# Patient Record
Sex: Male | Born: 1970
Health system: Southern US, Community
[De-identification: ages and names within clinical notes are randomized; demographics above are authoritative.]

## PROBLEM LIST (undated history)

## (undated) DIAGNOSIS — M549 Dorsalgia, unspecified: Secondary | ICD-10-CM

## (undated) DIAGNOSIS — Z789 Other specified health status: Secondary | ICD-10-CM

## (undated) DIAGNOSIS — H269 Unspecified cataract: Secondary | ICD-10-CM

## (undated) DIAGNOSIS — E785 Hyperlipidemia, unspecified: Secondary | ICD-10-CM

## (undated) DIAGNOSIS — E739 Lactose intolerance, unspecified: Secondary | ICD-10-CM

## (undated) DIAGNOSIS — K5790 Diverticulosis of intestine, part unspecified, without perforation or abscess without bleeding: Secondary | ICD-10-CM

## (undated) DIAGNOSIS — E559 Vitamin D deficiency, unspecified: Secondary | ICD-10-CM

## (undated) DIAGNOSIS — K829 Disease of gallbladder, unspecified: Secondary | ICD-10-CM

## (undated) DIAGNOSIS — F32A Depression, unspecified: Secondary | ICD-10-CM

## (undated) DIAGNOSIS — E663 Overweight: Secondary | ICD-10-CM

## (undated) DIAGNOSIS — H409 Unspecified glaucoma: Secondary | ICD-10-CM

## (undated) HISTORY — DX: Overweight: E66.3

## (undated) HISTORY — DX: Lactose intolerance, unspecified: E73.9

## (undated) HISTORY — DX: Hyperlipidemia, unspecified: E78.5

## (undated) HISTORY — DX: Unspecified cataract: H26.9

## (undated) HISTORY — PX: OTHER SURGICAL HISTORY: SHX169

## (undated) HISTORY — PX: COSMETIC SURGERY: SHX468

## (undated) HISTORY — DX: Dorsalgia, unspecified: M54.9

## (undated) HISTORY — DX: Unspecified glaucoma: H40.9

## (undated) HISTORY — DX: Depression, unspecified: F32.A

## (undated) HISTORY — DX: Disease of gallbladder, unspecified: K82.9

## (undated) HISTORY — DX: Diverticulosis of intestine, part unspecified, without perforation or abscess without bleeding: K57.90

## (undated) HISTORY — PX: CHOLECYSTECTOMY: SHX55

## (undated) HISTORY — DX: Vitamin D deficiency, unspecified: E55.9

---

## 1990-03-17 HISTORY — PX: CHOLECYSTECTOMY: SHX55

## 2007-04-26 ENCOUNTER — Ambulatory Visit (HOSPITAL_COMMUNITY): Admission: RE | Admit: 2007-04-26 | Discharge: 2007-04-26 | Payer: Self-pay | Admitting: Internal Medicine

## 2007-09-23 ENCOUNTER — Encounter (HOSPITAL_BASED_OUTPATIENT_CLINIC_OR_DEPARTMENT_OTHER): Payer: Self-pay | Admitting: General Surgery

## 2007-09-23 ENCOUNTER — Ambulatory Visit (HOSPITAL_BASED_OUTPATIENT_CLINIC_OR_DEPARTMENT_OTHER): Admission: RE | Admit: 2007-09-23 | Discharge: 2007-09-23 | Payer: Self-pay | Admitting: General Surgery

## 2008-05-29 ENCOUNTER — Ambulatory Visit (HOSPITAL_COMMUNITY): Admission: RE | Admit: 2008-05-29 | Discharge: 2008-05-29 | Payer: Self-pay | Admitting: Internal Medicine

## 2009-07-03 ENCOUNTER — Ambulatory Visit (HOSPITAL_COMMUNITY): Admission: RE | Admit: 2009-07-03 | Discharge: 2009-07-03 | Payer: Self-pay | Admitting: Internal Medicine

## 2010-07-30 NOTE — Op Note (Signed)
NAMEJOSEH, SJOGREN                   ACCOUNT NO.:  [id number]   MEDICAL RECORD NO.:  [id number]          PATIENT TYPE:  AMB   LOCATION:  DSC                          FACILITY:  [facility]   PHYSICIAN:  Leonie Man, M.D.   DATE OF BIRTH:  [birth year]   DATE OF PROCEDURE:  09/23/2007  DATE OF DISCHARGE:                               OPERATIVE REPORT   PREOPERATIVE DIAGNOSIS:  Lipoma, left upper arm.   POSTOPERATIVE DIAGNOSIS:  Lipoma, left upper arm.   PROCEDURE:  Excision, lipoma of left upper arm.   SURGEON:  Leonie Man, MD   ASSISTANT:  OR nurse.   ANESTHESIA:  General.   Mr. Cull is a 40 year old gentleman with an enlarging mass of the left  upper arm which has been present for some time, but increasing in the  last several weeks.  He is not having any significant pain, although, he  does have some degree of pain of about 2/10 over the past several weeks.  He comes to the operating room now for excision of this mass.   PROCEDURE:  Following the induction of satisfactory general anesthesia  with the patient positioned supine and the left arm is projected down by  the side and turned in the prone position to expose the outer left arm.  The area over the lipoma was prepped and draped to be included in a  sterile operative field.  Positive identification of the patient and the  procedure to be done is excision of a left upper arm lipoma was carried  out.  The area was infiltrated with 0.5% Marcaine plain.  I made a  transverse incision on the upper arm and deepened this through the skin  and subcutaneous tissue, down to the capsule of the lipoma.  This lipoma  was a multiloculated mass which extended for approximately 8 cm across  the arm, and this was dissected free from all of its interstices,  removed in its entirety, and forwarded for pathologic evaluation.  Hemostasis was obtained with electrocautery and sponge and instrument  counts were verified.  Wound was irrigated  with normal saline and then  closed with interrupted sutures of 3-0 Vicryl in the subcutaneous  tissues, and the skin was closed with 4-0 Monocryl.  The incision was  reinforced with Dermabond and Steri-Strips and a sterile dressing was  placed on the wound.  The anesthetic reversed and the patient removed  from the operating room to the recovery room in stable condition.  He  tolerated the procedure well.      Leonie Man, M.D.  Electronically Signed     PB/MEDQ  D:  09/23/2007  T:  09/24/2007  Job:  [id number]

## 2011-06-26 ENCOUNTER — Ambulatory Visit (HOSPITAL_COMMUNITY)
Admission: RE | Admit: 2011-06-26 | Discharge: 2011-06-26 | Disposition: A | Discharge status: Home / Self Care | Payer: BC Managed Care – PPO | Source: Ambulatory Visit | Attending: Internal Medicine | Admitting: Internal Medicine

## 2011-06-26 ENCOUNTER — Other Ambulatory Visit (HOSPITAL_COMMUNITY): Payer: Self-pay | Admitting: Internal Medicine

## 2011-06-26 DIAGNOSIS — M545 Low back pain, unspecified: Secondary | ICD-10-CM

## 2011-07-14 ENCOUNTER — Ambulatory Visit (HOSPITAL_COMMUNITY)
Admission: RE | Admit: 2011-07-14 | Discharge: 2011-07-14 | Disposition: A | Discharge status: Home / Self Care | Payer: BC Managed Care – PPO | Source: Ambulatory Visit | Attending: Orthopedic Surgery | Admitting: Orthopedic Surgery

## 2011-07-14 ENCOUNTER — Other Ambulatory Visit (HOSPITAL_COMMUNITY): Payer: Self-pay | Admitting: Orthopedic Surgery

## 2011-07-14 DIAGNOSIS — M545 Low back pain, unspecified: Secondary | ICD-10-CM

## 2011-07-14 DIAGNOSIS — M7130 Other bursal cyst, unspecified site: Secondary | ICD-10-CM | POA: Insufficient documentation

## 2011-07-14 DIAGNOSIS — M5126 Other intervertebral disc displacement, lumbar region: Secondary | ICD-10-CM | POA: Insufficient documentation

## 2013-02-02 ENCOUNTER — Encounter: Payer: Self-pay | Admitting: Internal Medicine

## 2013-02-02 DIAGNOSIS — E785 Hyperlipidemia, unspecified: Secondary | ICD-10-CM

## 2013-02-02 DIAGNOSIS — E559 Vitamin D deficiency, unspecified: Secondary | ICD-10-CM | POA: Insufficient documentation

## 2013-02-02 DIAGNOSIS — F32A Depression, unspecified: Secondary | ICD-10-CM

## 2013-02-02 DIAGNOSIS — F419 Anxiety disorder, unspecified: Secondary | ICD-10-CM

## 2013-02-02 DIAGNOSIS — F329 Major depressive disorder, single episode, unspecified: Secondary | ICD-10-CM

## 2013-02-02 DIAGNOSIS — E7800 Pure hypercholesterolemia, unspecified: Secondary | ICD-10-CM | POA: Insufficient documentation

## 2013-02-03 ENCOUNTER — Ambulatory Visit: Payer: Self-pay | Admitting: Emergency Medicine

## 2013-07-07 ENCOUNTER — Ambulatory Visit (INDEPENDENT_AMBULATORY_CARE_PROVIDER_SITE_OTHER): Payer: BC Managed Care – PPO | Admitting: Emergency Medicine

## 2013-07-07 ENCOUNTER — Encounter: Payer: Self-pay | Admitting: Emergency Medicine

## 2013-07-07 VITALS — BP 142/102 | HR 66 | Temp 98.4°F | Resp 18 | Ht 71.5 in | Wt 290.0 lb

## 2013-07-07 DIAGNOSIS — Z79899 Other long term (current) drug therapy: Secondary | ICD-10-CM

## 2013-07-07 DIAGNOSIS — Z23 Encounter for immunization: Secondary | ICD-10-CM

## 2013-07-07 DIAGNOSIS — E559 Vitamin D deficiency, unspecified: Secondary | ICD-10-CM

## 2013-07-07 DIAGNOSIS — R030 Elevated blood-pressure reading, without diagnosis of hypertension: Secondary | ICD-10-CM

## 2013-07-07 DIAGNOSIS — Z0000 Encounter for general adult medical examination without abnormal findings: Secondary | ICD-10-CM

## 2013-07-07 DIAGNOSIS — Z119 Encounter for screening for infectious and parasitic diseases, unspecified: Secondary | ICD-10-CM

## 2013-07-07 DIAGNOSIS — Z1212 Encounter for screening for malignant neoplasm of rectum: Secondary | ICD-10-CM

## 2013-07-07 LAB — CBC WITH DIFFERENTIAL/PLATELET
BASOS ABS: 0 10*3/uL (ref 0.0–0.1)
BASOS PCT: 0 % (ref 0–1)
EOS ABS: 0.2 10*3/uL (ref 0.0–0.7)
EOS PCT: 3 % (ref 0–5)
HEMATOCRIT: 44.2 % (ref 39.0–52.0)
Hemoglobin: 15.7 g/dL (ref 13.0–17.0)
Lymphocytes Relative: 30 % (ref 12–46)
Lymphs Abs: 2.3 10*3/uL (ref 0.7–4.0)
MCH: 30.1 pg (ref 26.0–34.0)
MCHC: 35.5 g/dL (ref 30.0–36.0)
MCV: 84.7 fL (ref 78.0–100.0)
Monocytes Absolute: 0.5 10*3/uL (ref 0.1–1.0)
Monocytes Relative: 6 % (ref 3–12)
Neutro Abs: 4.6 10*3/uL (ref 1.7–7.7)
Neutrophils Relative %: 61 % (ref 43–77)
Platelets: 253 10*3/uL (ref 150–400)
RBC: 5.22 MIL/uL (ref 4.22–5.81)
RDW: 14.3 % (ref 11.5–15.5)
WBC: 7.6 10*3/uL (ref 4.0–10.5)

## 2013-07-07 LAB — HEMOGLOBIN A1C
Hgb A1c MFr Bld: 5.6 % (ref <5.7)
MEAN PLASMA GLUCOSE: 114 mg/dL (ref <117)

## 2013-07-07 NOTE — Patient Instructions (Addendum)
Cholesterol Cholesterol is a type of fat. Your body needs a small amount of cholesterol, but too much can cause health problems. Certain problems include heart attacks, strokes, and not enough blood flow to your heart, brain, kidneys, or feet. You get cholesterol in 2 ways:  Naturally.  By eating certain foods. HOME CARE  Eat a low-fat diet:  Eat less eggs, whole dairy products (whole milk, cheese, and butter), fatty meats, and fried foods.  Eat more fruits, vegetables, whole-wheat breads, lean chicken, and fish.  Follow your exercise program as told by your doctor.  Keep your weight at a healthy level. Talk to your doctor about what is right for you.  Only take medicine as told by your doctor.  Get your cholesterol checked once a year or as told by your doctor. MAKE SURE YOU:  Understand these instructions.  Will watch your condition.  Will get help right away if you are not doing well or get worse. Document Released: 05/30/2008 Document Revised: 05/26/2011 Document Reviewed: 12/15/2012 [facility] Patient Information 2014 [place], [place]. We want weight loss that will last so you should lose 1-2 pounds a week.  THAT IS IT! Please pick THREE things a month to change. Once it is a habit check off the item. Then pick another three items off the list to become habits.  If you are already doing a habit on the list GREAT!  Cross that item off! o Don't drink your calories. Ie, alcohol, soda, fruit juice, and sweet tea.  o Drink more water. Drink a glass when you feel hungry or before each meal.  o Eat breakfast - Complex carb and protein (likeDannon light and fit yogurt, oatmeal, fruit, eggs, [place] bacon). o Measure your cereal.  Eat no more than one cup a day. (ie [place]) o Eat an apple a day. o Add a vegetable a day. o Try a new vegetable a month. o Use Pam! Stop using oil or butter to cook. o Don't finish your plate or use smaller plates. o Share your dessert. o Eat sugar free  Jello for dessert or frozen grapes. o Don't eat 2-3 hours before bed. o Switch to whole wheat bread, pasta, and brown rice. o Make healthier choices when you eat out. No fries! o Pick baked chicken, NOT fried. o Don't forget to SLOW DOWN when you eat. It is not going anywhere.  o Take the stairs. o Park far away in the parking lot o [employer] (or weights) for 10 minutes while watching TV. o Walk at work for 10 minutes during break. o Walk outside 1 time a week with your friend, kids, dog, or significant other. o Start a walking group at [place] the mall as much as you can tolerate.  o Keep a food diary. o Weigh yourself daily. o Walk for 15 minutes 3 days per week. o Cook at home more often and eat out less.  If life happens and you go back to old habits, it is okay.  Just start over. You can do it!   If you experience chest pain, get short of breath, or tired during the exercise, please stop immediately and inform your doctor.    Bad carbs also include fruit juice, alcohol, and sweet tea. These are empty calories that do not signal to your brain that you are full.   Please remember the good carbs are still carbs which convert into sugar. So please measure them out no more than 1/2-1 cup  of rice, oatmeal, pasta, and beans.  Veggies are however free foods! Pile them on.   I like lean protein at every meal such as chicken, [place], pork chops, cottage cheese, etc. Just do not fry these meats and please center your meal around vegetable, the meats should be a side dish.   No all fruit is created equal. Please see the list below, the fruit at the bottom is higher in sugars than the fruit at the top   Hypertension Hypertension is another name for high blood pressure. High blood pressure may mean that your heart needs to work harder to pump blood. Blood pressure consists of two numbers, which includes a higher number over a lower number (example: 110/72). HOME CARE   Make  lifestyle changes as told by your doctor. This may include weight loss and exercise.  Take your blood pressure medicine every day.  Limit how much salt you use.  Stop smoking if you smoke.  Do not use drugs.  Talk to your doctor if you are using decongestants or birth control pills. These medicines might make blood pressure higher.  Females should not drink more than 1 alcoholic drink per day. Males should not drink more than 2 alcoholic drinks per day.  See your doctor as told. GET HELP RIGHT AWAY IF:   You have a blood pressure reading with a top number of 180 or higher.  You get a very bad headache.  You get blurred or changing vision.  You feel confused.  You feel weak, numb, or faint.  You get chest or belly (abdominal) pain.  You throw up (vomit).  You cannot breathe very well. MAKE SURE YOU:   Understand these instructions.  Will watch your condition.  Will get help right away if you are not doing well or get worse. Document Released: 08/20/2007 Document Revised: 05/26/2011 Document Reviewed: 08/20/2007 [facility] Patient Information 2014 [place], [place].

## 2013-07-07 NOTE — Progress Notes (Signed)
Subjective:    Patient ID: Richard [name], male    DOB: 1970/12/31, 43 y.o.   MRN: [id number]  HPI Comments: 43 yo WM CPE and obesity/ cholesterol F/U. He notes he is not eating healthy routinely or exercising. He has lost 2 # since last year. He is off all RX and supplements because he wanted to see what labs would result.  Last abnormal labs  TG 208 H 33 L 117 D 39. He had negative STD panel 2014 and denies any STD symptoms today but wants rechecked.  He notes mood has improved and he does not need anxiety/ depression RX any longer. He is continuing counseling and feels this has helped greatly.   He has no other concerns     Medication List    Notice As of 07/07/2013 11:59 PM   You have not been prescribed any medications.     Allergies  Allergen Reactions  . Ppd [Tuberculin Purified Protein Derivative]     + ppd NEG CXR 2/09   Past Medical History  Diagnosis Date  . Hyperlipidemia   . Depression   . Anxiety   . Unspecified vitamin D deficiency    Past Surgical History  Procedure Laterality Date  . Cholecystectomy    . Cosmetic surgery      lipoma removal, arm/ forehead   Family History  Problem Relation Age of Onset  . Diabetes Mother   . Cancer Mother 39    breast, skin,lymphoma, deceased  . Cancer Father     prostate, skin  . Depression Sister   . Cancer Sister     sarcoidosis  . Depression Brother    History  Substance Use Topics  . Smoking status: Never Smoker   . Smokeless tobacco: Not on file  . Alcohol Use: Yes     Comment: rare   Patient Care Team: Unk Pinto, MD as PCP - General (Internal Medicine) Rozetta Nunnery, MD as Consulting Physician (Otolaryngology) Fischer, (Dentist) [facility]  MAINTENANCE: EVO:[id number] Dentist:Q 6 month  IMMUNIZATIONS: Tdap:07/07/13 Influenza:2014   Review of Systems  All other systems reviewed and are negative.  BP 142/102  Pulse 66  Temp(Src) 98.4 F (36.9 C) (Temporal)  Resp 18  Ht 5' 11.5"  (1.816 m)  Wt 290 lb (131.543 kg)  BMI 39.89 kg/m2 Recheck 130/90    Objective:   Physical Exam  Nursing note and vitals reviewed. Constitutional: He is oriented to person, place, and time. He appears well-developed and well-nourished.  obese  HENT:  Head: Normocephalic and atraumatic.  Right Ear: External ear normal.  Left Ear: External ear normal.  Nose: Nose normal.  Mouth/Throat: No oropharyngeal exudate.  Eyes: Conjunctivae and EOM are normal. Pupils are equal, round, and reactive to light. Right eye exhibits no discharge. Left eye exhibits no discharge. No scleral icterus.  Neck: Normal range of motion. Neck supple. No JVD present. No tracheal deviation present. No thyromegaly present.  Cardiovascular: Normal rate, regular rhythm, normal heart sounds and intact distal pulses.   Pulmonary/Chest: Effort normal and breath sounds normal.  Abdominal: Soft. Bowel sounds are normal. He exhibits no distension. There is no tenderness. There is no rebound and no guarding.  Genitourinary:  Def 2016  Musculoskeletal: Normal range of motion. He exhibits no edema and no tenderness.  Lymphadenopathy:    He has no cervical adenopathy.  Neurological: He is alert and oriented to person, place, and time. He has normal reflexes. No cranial nerve deficit. He exhibits normal muscle  tone. Coordination normal.  Skin: Skin is warm and dry. No rash noted. No erythema. No pallor.  Right great toe nail thick/ discolored. Between all toes mild erythema/ excoriation  Psychiatric: He has a normal mood and affect. His behavior is normal. Judgment and thought content normal.      EKG NSCSPT WNL     Assessment & Plan:  1. CPE- Update screening labs/ History/ Immunizations/ Testing as needed. Advised healthy diet, QD exercise, increase H20 and continue RX/ Vitamins AD.  2.  Irreg Nevi- monitor for any change, call if occurs for removal  3. Athletes foot/ nail fungus- advised of hygiene, try gold bond  powder OTC  4. Anxiety HX- Seems controlled, continue counseling AD  5. Cholesterol- recheck labs, Need to eat healthier and exercise AD.  6. Obesity- Continue weight loss, increase activity and better diet. Pt aware of risks. Check labs

## 2013-07-08 LAB — URINALYSIS, ROUTINE W REFLEX MICROSCOPIC
Bilirubin Urine: NEGATIVE
Glucose, UA: NEGATIVE mg/dL
HGB URINE DIPSTICK: NEGATIVE
KETONES UR: NEGATIVE mg/dL
Leukocytes, UA: NEGATIVE
NITRITE: NEGATIVE
PROTEIN: NEGATIVE mg/dL
SPECIFIC GRAVITY, URINE: 1.014 (ref 1.005–1.030)
UROBILINOGEN UA: 0.2 mg/dL (ref 0.0–1.0)
pH: 6 (ref 5.0–8.0)

## 2013-07-08 LAB — HEPATIC FUNCTION PANEL
ALBUMIN: 4.7 g/dL (ref 3.5–5.2)
ALT: 53 U/L (ref 0–53)
AST: 28 U/L (ref 0–37)
Alkaline Phosphatase: 66 U/L (ref 39–117)
BILIRUBIN DIRECT: 0.2 mg/dL (ref 0.0–0.3)
BILIRUBIN INDIRECT: 0.7 mg/dL (ref 0.2–1.2)
BILIRUBIN TOTAL: 0.9 mg/dL (ref 0.2–1.2)
Total Protein: 6.7 g/dL (ref 6.0–8.3)

## 2013-07-08 LAB — BASIC METABOLIC PANEL WITH GFR
BUN: 9 mg/dL (ref 6–23)
CALCIUM: 9.8 mg/dL (ref 8.4–10.5)
CO2: 27 mEq/L (ref 19–32)
CREATININE: 1 mg/dL (ref 0.50–1.35)
Chloride: 101 mEq/L (ref 96–112)
GLUCOSE: 84 mg/dL (ref 70–99)
POTASSIUM: 3.9 meq/L (ref 3.5–5.3)
SODIUM: 140 meq/L (ref 135–145)

## 2013-07-08 LAB — VITAMIN D 25 HYDROXY (VIT D DEFICIENCY, FRACTURES): VIT D 25 HYDROXY: 28 ng/mL — AB (ref 30–89)

## 2013-07-08 LAB — LIPID PANEL
CHOLESTEROL: 193 mg/dL (ref 0–200)
HDL: 40 mg/dL (ref >39)
LDL CALC: 106 mg/dL — AB (ref 0–99)
Total CHOL/HDL Ratio: 4.8 Ratio
Triglycerides: 236 mg/dL — ABNORMAL HIGH (ref <150)
VLDL: 47 mg/dL — ABNORMAL HIGH (ref 0–40)

## 2013-07-08 LAB — HSV(HERPES SIMPLEX VRS) I + II AB-IGG: HSV 1 Glycoprotein G Ab, IgG: <0.1 IV

## 2013-07-08 LAB — PSA: PSA: 0.98 ng/mL (ref <=4.00)

## 2013-07-08 LAB — GC PROBE AMPLIFICATION, URINE: GC Probe Amp, Urine: NEGATIVE

## 2013-07-08 LAB — MAGNESIUM: MAGNESIUM: 1.8 mg/dL (ref 1.5–2.5)

## 2013-07-08 LAB — MICROALBUMIN / CREATININE URINE RATIO
Creatinine, Urine: 115.2 mg/dL
MICROALB UR: 4.07 mg/dL — AB (ref 0.00–1.89)
MICROALB/CREAT RATIO: 35.3 mg/g — AB (ref 0.0–30.0)

## 2013-07-08 LAB — HEPATITIS PANEL, ACUTE
HCV AB: NEGATIVE
HEP B C IGM: NONREACTIVE
Hep A IgM: NONREACTIVE
Hepatitis B Surface Ag: NEGATIVE

## 2013-07-08 LAB — RPR

## 2013-07-08 LAB — TSH: TSH: 2.014 u[IU]/mL (ref 0.350–4.500)

## 2013-07-08 LAB — INSULIN, FASTING: INSULIN FASTING, SERUM: 12 u[IU]/mL (ref 3–28)

## 2013-07-08 LAB — TESTOSTERONE: TESTOSTERONE: 312 ng/dL (ref 300–890)

## 2013-07-08 LAB — HIV ANTIBODY (ROUTINE TESTING W REFLEX): HIV 1&2 Ab, 4th Generation: NONREACTIVE

## 2013-08-11 ENCOUNTER — Ambulatory Visit: Payer: Self-pay | Admitting: Emergency Medicine

## 2013-08-16 ENCOUNTER — Encounter: Payer: Self-pay | Admitting: Internal Medicine

## 2013-10-10 ENCOUNTER — Ambulatory Visit (INDEPENDENT_AMBULATORY_CARE_PROVIDER_SITE_OTHER): Payer: BC Managed Care – PPO | Admitting: Internal Medicine

## 2013-10-10 VITALS — BP 124/82 | HR 77 | Temp 98.0°F | Resp 16 | Ht 71.5 in | Wt 296.5 lb

## 2013-10-10 DIAGNOSIS — M542 Cervicalgia: Secondary | ICD-10-CM

## 2013-10-10 DIAGNOSIS — S139XXA Sprain of joints and ligaments of unspecified parts of neck, initial encounter: Secondary | ICD-10-CM

## 2013-10-10 DIAGNOSIS — S161XXA Strain of muscle, fascia and tendon at neck level, initial encounter: Secondary | ICD-10-CM

## 2013-10-10 MED ORDER — METHOCARBAMOL 750 MG PO TABS: 750.0000 mg | ORAL_TABLET | Freq: Four times a day (QID) | ORAL | Status: DC

## 2013-10-10 MED ORDER — HYDROCODONE-ACETAMINOPHEN 5-325 MG PO TABS: 1.0000 | ORAL_TABLET | Freq: Four times a day (QID) | ORAL | Status: DC | PRN

## 2013-10-10 NOTE — Progress Notes (Signed)
   Subjective:    Patient ID: Richard [name], male    DOB: 10-Sep-1970, 43 y.o.   MRN: [id number]  HPI A 43 year old male states he noticed a slightly hard knot on the right side of his neck on this past Friday. He states the knot has moved from his neck towards his right shoulder.  Pain is sharp and has moved from the neck to his shoulder down his right arm. He states he has been taking OTC Ibuprofen, hot/cold compress with no relief. No swelling indicated. He also states that pain wakes him up during the night. Denies fever, chills, night sweats or prior injury. Woke up with pain, no injury and no phx of neck problems  Review of Systems     Objective:   Physical Exam  Constitutional: He is oriented to person, place, and time. He appears well-developed and well-nourished. No distress.  HENT:  Head: Normocephalic.  Eyes: EOM are normal.  Pulmonary/Chest: Effort normal.  Musculoskeletal:       Cervical back: He exhibits decreased range of motion, tenderness, bony tenderness and pain. He exhibits no swelling, no edema, no deformity, no laceration and normal pulse.  Neurological: He is alert and oriented to person, place, and time. He has normal reflexes. No cranial nerve deficit or sensory deficit. He exhibits normal muscle tone. Coordination normal.          Assessment & Plan:  Wry neck/Strain neck Robaxin/RICE/Vicodin

## 2013-10-10 NOTE — Patient Instructions (Signed)

## 2013-10-10 NOTE — Progress Notes (Signed)
   Subjective:    Patient ID: Richard [name], male    DOB: 16-Jun-1970, 43 y.o.   MRN: [id number]  HPI    Review of Systems     Objective:   Physical Exam        Assessment & Plan:

## 2014-07-17 ENCOUNTER — Encounter: Payer: Self-pay | Admitting: Emergency Medicine

## 2015-06-19 DIAGNOSIS — F320 Major depressive disorder, single episode, mild: Secondary | ICD-10-CM | POA: Diagnosis not present

## 2015-06-26 ENCOUNTER — Encounter: Payer: Self-pay | Admitting: Primary Care

## 2015-06-26 ENCOUNTER — Other Ambulatory Visit: Payer: Self-pay | Admitting: Internal Medicine

## 2015-06-26 ENCOUNTER — Ambulatory Visit (INDEPENDENT_AMBULATORY_CARE_PROVIDER_SITE_OTHER): Payer: BLUE CROSS/BLUE SHIELD | Admitting: Primary Care

## 2015-06-26 VITALS — BP 124/72 | HR 60 | Temp 97.7°F | Ht 71.0 in | Wt 261.0 lb

## 2015-06-26 DIAGNOSIS — E785 Hyperlipidemia, unspecified: Secondary | ICD-10-CM

## 2015-06-26 DIAGNOSIS — D179 Benign lipomatous neoplasm, unspecified: Secondary | ICD-10-CM

## 2015-06-26 DIAGNOSIS — R229 Localized swelling, mass and lump, unspecified: Secondary | ICD-10-CM

## 2015-06-26 DIAGNOSIS — L609 Nail disorder, unspecified: Secondary | ICD-10-CM | POA: Diagnosis not present

## 2015-06-26 DIAGNOSIS — R0981 Nasal congestion: Secondary | ICD-10-CM | POA: Insufficient documentation

## 2015-06-26 DIAGNOSIS — L608 Other nail disorders: Secondary | ICD-10-CM

## 2015-06-26 NOTE — Patient Instructions (Signed)
Congratulations on your weight loss! Continue your healthy diet and exercise. Take a look at my recommendations below.  Nuts: Try Raw nuts as they contain the healthy oils and nutrients that are lost when they are "processed or roasted".   Vegetables: Incorporate vegetables with each meal if possible. Limit starchy vegetables such as corn and potatoes.  Reduce consumption of coffee over time.   You will be contacted regarding your referral to Podiatry and Dermatology.  Please let us know if you have not heard back within one week.   Please schedule a physical with me within the next 6 months. You may also schedule a lab only appointment 3-4 days prior. We will discuss your lab results in detail during your physical.  It was a pleasure to meet you today! Please don't hesitate to call me with any questions. Welcome to [facility]!

## 2015-06-26 NOTE — Assessment & Plan Note (Signed)
History of 2 lipomas removed in past. 2 cm mass to occipital cranium x 6 years, enlarging, does not cause pain. Feels to be benign lipoma or cyst.  Referral made to dermatology for further evaluation.

## 2015-06-26 NOTE — Assessment & Plan Note (Signed)
History of for most of his life. Currently meeting with ENT in [place] to discuss surgical options. Currently on Afrin and also scheduled for sleep study in May.

## 2015-06-26 NOTE — Assessment & Plan Note (Signed)
Did not mention today, will repeat this summer during annual physical exam. Currently not managed on medication, but is also working on weight loss through healthy diet and exercise.

## 2015-06-26 NOTE — Progress Notes (Signed)
Pre visit review using our clinic review tool, if applicable. No additional management support is needed unless otherwise documented below in the visit note. 

## 2015-06-26 NOTE — Progress Notes (Signed)
Subjective:    Patient ID: Richard [name], male    DOB: [birth year], 45 y.o.   MRN: [id number]  HPI  Richard [name] is a 45 year old male who presents today to establish care and discuss the problems mentioned below. [name] obtain old records. His last physical was about 1 year.   1) Lipoma: History of numerous lipomas. He's had them removed to his left upper extremity and forehead several years ago. He now has a lipoma to the occipital region that has been present for the past 6-7 years. He believes this lipoma is enlarging. Denies headaches.   2) Dermatological Issues: Cracked skin to the plantar surface of his bilateral feet since Thanksgiving 2016 when he started exercising. Over the past several months he's noticed increased cracking to his feet. He [name] train with a personal trainer once weekly and [name] run 3 days of the week. He endorses wearing appropriate shoes. He's noticed nail discoloration and warping of the toe nails of bilateral great toes over the past several years. He would like evaluation as this is embarrassing. Denies pain to the plantar surface of his feet or to toe nails.   3) Obesity: Struggled with most of his life. In 2008 he was at high risk for MI as he weighed over 300 pounds so he participated in a program for healthy lifestyle changes and lost 100 pounds. Over the years he has slowly re-gained his weight. He decided this past Thanksgiving to improve his diet as he was nearing 300 pounds. He's currently eating a "paleo" diet and exercising 4 days weekly. He's lost 38 pounds since Thanksgiving 2016.   His diet currently consists of: Breakfast: Eggs with protein, coffee Lunch: Skips as he is busy. [name] eat "RX" bars occasionally. Dinner: Chicken (Grilled/baked, sometimes chicken wings or nuggets), vegetable, fruit. He's limiting bread and sugars. Snacks: Nuts and fruit Beverages: Black coffee (60 ounces of coffee daily), water (64-96 ounces daily)  4) Nasal Congestion: Chronic  most of his life. He experiences daily congestion and a reduction in nasal airway passage. He was evaluated by an ENT in [facility] who recommended Afrin and sleep study. He is scheduled for a sleep study in May 2017 as he constantly snores. He is considering nasal septum surgery and also possibly to his soft palate. He notices his congestion is improved with exercise. He is still considering surgery but has not yet made a decision.  Review of Systems  Constitutional: Negative for unexpected weight change.  HENT: Positive for congestion.   Respiratory: Negative for shortness of breath.   Cardiovascular: Negative for chest pain.  Genitourinary:       FH of prostate cancer in his father.  Skin:       Dry, cracked skin to heels bilaterally. Misshapen toe nails to great toes. Lipoma to occipital region.       Past Medical History  Diagnosis Date  . Hyperlipidemia   . Depression   . Anxiety   . Unspecified vitamin D deficiency     Social History   Social History  . Marital Status: Divorced    Spouse Name: N/A  . Number of Children: N/A  . Years of Education: N/A   Occupational History  . Not on file.   Social History Main Topics  . Smoking status: Never Smoker   . Smokeless tobacco: Not on file  . Alcohol Use: 0.0 oz/week    0 Standard drinks or equivalent per week     Comment: rare  .  Drug Use: No  . Sexual Activity: Not on file   Other Topics Concern  . Not on file   Social History Narrative    Past Surgical History  Procedure Laterality Date  . Cholecystectomy    . Cosmetic surgery      lipoma removal, arm/ forehead    Family History  Problem Relation Age of Onset  . Diabetes Mother   . Cancer Mother 9    breast, skin,lymphoma, deceased  . Cancer Father     prostate, skin  . Depression Sister   . Cancer Sister     sarcoidosis  . Depression Brother     Allergies  Allergen Reactions  . Ppd [Tuberculin Purified Protein Derivative]     + ppd NEG CXR  2/09    No current outpatient prescriptions on file prior to visit.   No current facility-administered medications on file prior to visit.    BP 124/72 mmHg  Pulse 60  Temp(Src) 97.7 F (36.5 C) (Oral)  Ht 5\' 11"  (1.803 m)  Wt 261 lb (118.389 kg)  BMI 36.42 kg/m2  SpO2 96%    Objective:   Physical Exam  Constitutional: He is oriented to person, place, and time. He appears well-nourished.  Neck: Neck supple.  Cardiovascular: Normal rate and regular rhythm.   Pulmonary/Chest: Effort normal and breath sounds normal. He has no wheezes. He has no rales.  Neurological: He is alert and oriented to person, place, and time.  Skin: Skin is warm and dry.  Mild to moderate cracked skin to right heel. No s/s of infection. Bilateral great toes with yellow color and misshapen toe nails.   2 cm mass to occipital region of cranium, lipoma or cyst like.   Psychiatric: He has a normal mood and affect.          Assessment & Plan:

## 2015-06-26 NOTE — Assessment & Plan Note (Signed)
Located to right great toe nail with yellow discoloration. Suspect shape due to gait from obesity. Will send to podiatry for evaluation and possibly for orthotics.

## 2015-07-03 DIAGNOSIS — F320 Major depressive disorder, single episode, mild: Secondary | ICD-10-CM | POA: Diagnosis not present

## 2015-07-10 ENCOUNTER — Encounter: Payer: Self-pay | Admitting: Primary Care

## 2015-07-17 DIAGNOSIS — F320 Major depressive disorder, single episode, mild: Secondary | ICD-10-CM | POA: Diagnosis not present

## 2015-07-24 DIAGNOSIS — F320 Major depressive disorder, single episode, mild: Secondary | ICD-10-CM | POA: Diagnosis not present

## 2015-07-26 ENCOUNTER — Ambulatory Visit (INDEPENDENT_AMBULATORY_CARE_PROVIDER_SITE_OTHER): Payer: BLUE CROSS/BLUE SHIELD | Admitting: Podiatry

## 2015-07-26 ENCOUNTER — Encounter: Payer: Self-pay | Admitting: Podiatry

## 2015-07-26 VITALS — BP 154/85 | HR 69 | Resp 18

## 2015-07-26 DIAGNOSIS — B353 Tinea pedis: Secondary | ICD-10-CM | POA: Diagnosis not present

## 2015-07-26 DIAGNOSIS — L853 Xerosis cutis: Secondary | ICD-10-CM

## 2015-07-26 DIAGNOSIS — B351 Tinea unguium: Secondary | ICD-10-CM

## 2015-07-26 MED ORDER — UREA 40 % EX CREA: 1.0000 "application " | TOPICAL_CREAM | Freq: Every day | CUTANEOUS | Status: DC

## 2015-07-26 MED ORDER — KETOCONAZOLE 2 % EX CREA: 1.0000 "application " | TOPICAL_CREAM | Freq: Every day | CUTANEOUS | Status: DC

## 2015-07-26 NOTE — Progress Notes (Signed)
   Subjective:    Patient ID: Richard [name], male    DOB: [birth year], 45 y.o.   MRN: [id number]  HPI  45 year old male presents the also concerns of fungus to both of his big toenail which is been ongoing for about 2 years. He states the areas are not painful but he is concerned of the way it looks. He has had pain and previously was doing running or a lot of activity. He has tried soaking in Epson salts. He also has dry skin to his heels. Denies any cracking, drainage. No other complaints.    Review of Systems  All other systems reviewed and are negative.      Objective:   Physical Exam General: AAO x3, NAD  Dermatological: Bilateral hallux nails are somewhat dystrophic, discolored, hypertrophic and incurvated along the medial and lateral nail borders. There is no tenderness the nails there is no edema, erythema, drainage or pus. There is dry, xerotic skin the plantar aspect of the foot into the heels. No open lesions. No fissuring. There is also small amount of dry, xerotic, erythematous skin interdigitally consistent with tinea pedis. There is no pain with calf compression, swelling, warmth, erythema.   Vascular: DP/PT pulses 2/4, CRT less than 3 seconds. There is no pain with calf compression, swelling, warmth, erythema.  Neruologic: Grossly intact via light touch bilateral. Vibratory intact via tuning fork bilateral. Protective threshold with Semmes Wienstein monofilament intact to all pedal sites bilateral. Patellar and Achilles deep tendon reflexes 2+ bilateral. No Babinski or clonus noted bilateral.   Musculoskeletal: No gross boney pedal deformities bilateral. No pain, crepitus, or limitation noted with foot and ankle range of motion bilateral. Muscular strength 5/5 in all groups tested bilateral.  Gait: Unassisted, Nonantalgic.       Assessment & Plan:  45 year old male with tinea pedis, xerosis, onychodystrophy likely onychomycosis -Treatment options discussed including all  alternatives, risks, and complications -Etiology of symptoms were discussed -Prescribed urea cream for dry skin. He can also use the stoma toenails.  -Ketoconazole for tinea pedis  -Hallux nails were sent for biopsy to [facility] -Follow-up after nail culture or sooner if any problems arise. In the meantime, encouraged to call the office with any questions, concerns, change in symptoms.   Celesta Gentile, DPM

## 2015-07-26 NOTE — Patient Instructions (Signed)
Onychomycosis/Fungal Toenails  WHAT IS IT? An infection that lies within the keratin of your nail plate that is caused by a fungus.  WHY ME? Fungal infections affect all ages, sexes, races, and creeds.  There may be many factors that predispose you to a fungal infection such as age, coexisting medical conditions such as diabetes, or an autoimmune disease; stress, medications, fatigue, genetics, etc.  Bottom line: fungus thrives in a warm, moist environment and your shoes offer such a location.  IS IT CONTAGIOUS? Theoretically, yes.  You do not want to share shoes, nail clippers or files with someone who has fungal toenails.  Walking around barefoot in the same room or sleeping in the same bed is unlikely to transfer the organism.  It is important to realize, however, that fungus can spread easily from one nail to the next on the same foot.  HOW DO WE TREAT THIS?  There are several ways to treat this condition.  Treatment may depend on many factors such as age, medications, pregnancy, liver and kidney conditions, etc.  It is best to ask your doctor which options are available to you.  1. No treatment.   Unlike many other medical concerns, you can live with this condition.  However for many people this can be a painful condition and may lead to ingrown toenails or a bacterial infection.  It is recommended that you keep the nails cut short to help reduce the amount of fungal nail. 2. Topical treatment.  These range from herbal remedies to prescription strength nail lacquers.  About 40-50% effective, topicals require twice daily application for approximately 9 to 12 months or until an entirely new nail has grown out.  The most effective topicals are medical grade medications available through physicians offices. 3. Oral antifungal medications.  With an 80-90% cure rate, the most common oral medication requires 3 to 4 months of therapy and stays in your system for a year as the new nail grows out.  Oral  antifungal medications do require blood work to make sure it is a safe drug for you.  A liver function panel will be performed prior to starting the medication and after the first month of treatment.  It is important to have the blood work performed to avoid any harmful side effects.  In general, this medication safe but blood work is required. 4. Laser Therapy.  This treatment is performed by applying a specialized laser to the affected nail plate.  This therapy is noninvasive, fast, and non-painful.  It is not covered by insurance and is therefore, out of pocket.  The results have been very good with a 80-95% cure rate.  [facility] is the only practice in the area to offer this therapy. 5. Permanent Nail Avulsion.  Removing the entire nail so that a new nail will not grow back.  Athlete's Foot Athlete's foot (tinea pedis) is a fungal infection of the skin on the feet. It often occurs on the skin between the toes or underneath the toes. It can also occur on the soles of the feet. Athlete's foot is more likely to occur in hot, humid weather. Not washing your feet or changing your socks often enough can contribute to athlete's foot. The infection can spread from person to person (contagious). CAUSES Athlete's foot is caused by a fungus. This fungus thrives in warm, moist places. Most people get athlete's foot by sharing shower stalls, towels, and wet floors with an infected person. People with weakened  immune systems, including those with diabetes, may be more likely to get athlete's foot. SYMPTOMS  6. Itchy areas between the toes or on the soles of the feet. 7. White, flaky, or scaly areas between the toes or on the soles of the feet. 8. Tiny, intensely itchy blisters between the toes or on the soles of the feet. 9. Tiny cuts on the skin. These cuts can develop a bacterial infection. 10. Thick or discolored toenails. DIAGNOSIS  Your caregiver can usually tell what the problem is by doing a  physical exam. Your caregiver may also take a skin sample from the rash area. The skin sample may be examined under a microscope, or it may be tested to see if fungus will grow in the sample. A sample may also be taken from your toenail for testing. TREATMENT  Over-the-counter and prescription medicines can be used to kill the fungus. These medicines are available as powders or creams. Your caregiver can suggest medicines for you. Fungal infections respond slowly to treatment. You may need to continue using your medicine for several weeks. PREVENTION   Do not share towels.  Wear sandals in wet areas, such as shared locker rooms and shared showers.  Keep your feet dry. Wear shoes that allow air to circulate. Wear cotton or wool socks. HOME CARE INSTRUCTIONS   Take medicines as directed by your caregiver. Do not use steroid creams on athlete's foot.  Keep your feet clean and cool. Wash your feet daily and dry them thoroughly, especially between your toes.  Change your socks every day. Wear cotton or wool socks. In hot climates, you may need to change your socks 2 to 3 times per day.  Wear sandals or canvas tennis shoes with good air circulation.  If you have blisters, soak your feet in Burow's solution or Epsom salts for 20 to 30 minutes, 2 times a day to dry out the blisters. Make sure you dry your feet thoroughly afterward. SEEK MEDICAL CARE IF:   You have a fever.  You have swelling, soreness, warmth, or redness in your foot.  You are not getting better after 7 days of treatment.  You are not completely cured after 30 days.  You have any problems caused by your medicines. MAKE SURE YOU:   Understand these instructions.  Will watch your condition.  Will get help right away if you are not doing well or get worse.   This information is not intended to replace advice given to you by your health care provider. Make sure you discuss any questions you have with your health care  provider.   Document Released: 02/29/2000 Document Revised: 05/26/2011 Document Reviewed: 09/04/2014 Elsevier Interactive Patient Education [employer].

## 2015-07-29 ENCOUNTER — Encounter: Payer: Self-pay | Admitting: Podiatry

## 2015-07-29 DIAGNOSIS — L853 Xerosis cutis: Secondary | ICD-10-CM | POA: Insufficient documentation

## 2015-07-29 DIAGNOSIS — B353 Tinea pedis: Secondary | ICD-10-CM | POA: Insufficient documentation

## 2015-08-06 ENCOUNTER — Ambulatory Visit (HOSPITAL_BASED_OUTPATIENT_CLINIC_OR_DEPARTMENT_OTHER): Payer: BLUE CROSS/BLUE SHIELD | Attending: Otolaryngology | Admitting: Internal Medicine

## 2015-08-06 VITALS — Ht 71.0 in | Wt 247.0 lb

## 2015-08-06 DIAGNOSIS — R0902 Hypoxemia: Secondary | ICD-10-CM | POA: Insufficient documentation

## 2015-08-06 DIAGNOSIS — R0683 Snoring: Secondary | ICD-10-CM | POA: Insufficient documentation

## 2015-08-06 DIAGNOSIS — G4733 Obstructive sleep apnea (adult) (pediatric): Secondary | ICD-10-CM | POA: Diagnosis not present

## 2015-08-07 DIAGNOSIS — F320 Major depressive disorder, single episode, mild: Secondary | ICD-10-CM | POA: Diagnosis not present

## 2015-08-12 DIAGNOSIS — R0683 Snoring: Secondary | ICD-10-CM

## 2015-08-12 NOTE — Procedures (Signed)
  Patient Name: Richard [name], Richard [name] Date: 08/06/2015 Gender: Male D.O.B: March 18, 1970 Age (years): 45 Referring Provider: [name] Height (inches): 71 Interpreting Physician: Baird Lyons MD, ABSM Weight (lbs): 247 RPSGT: Joni Reining BMI: 34 MRN: [id number] Neck Size: 17.00 CLINICAL INFORMATION Sleep Study Type: NPSG Indication for sleep study: Snoring Epworth Sleepiness Score: 12  SLEEP STUDY TECHNIQUE As per the AASM Manual for the Scoring of Sleep and Associated Events v2.3 (April 2016) with a hypopnea requiring 4% desaturations. The channels recorded and monitored were frontal, central and occipital EEG, electrooculogram (EOG), submentalis EMG (chin), nasal and oral airflow, thoracic and abdominal wall motion, anterior tibialis EMG, snore microphone, electrocardiogram, and pulse oximetry.  MEDICATIONS Patient's medications include: charted for review. Medications self-administered by patient during sleep study : No sleep medicine administered.  SLEEP ARCHITECTURE The study was initiated at 10:50:56 PM and ended at 4:52:39 AM. Sleep onset time was 4.0 minutes and the sleep efficiency was 94.5%. The total sleep time was 341.7 minutes. Stage REM latency was 81.0 minutes. The patient spent 8.05% of the night in stage N1 sleep, 74.25% in stage N2 sleep, 0.00% in stage N3 and 17.70% in REM. Alpha intrusion was absent. Supine sleep was 38.64%.  RESPIRATORY PARAMETERS The overall apnea/hypopnea index (AHI) was 9.0 per hour. There were 16 total apneas, including 16 obstructive, 0 central and 0 mixed apneas. There were 35 hypopneas and 15 RERAs. The AHI during Stage REM sleep was 29.8 per hour. AHI while supine was 21.8 per hour. The mean oxygen saturation was 92.30%. The minimum SpO2 during sleep was 80.00%. Moderate snoring was noted during this study.  CARDIAC DATA The 2 lead EKG demonstrated sinus rhythm. The mean heart rate was 54.22 beats per minute. Other EKG  findings include: None.  LEG MOVEMENT DATA The total PLMS were 0 with a resulting PLMS index of 0.00. Associated arousal with leg movement index was 0.0 .  IMPRESSIONS - Mild obstructive sleep apnea occurred during this study (AHI = 9.0/h). - There were not enough early events to qualify for split CPAP titration protocol. - No significant central sleep apnea occurred during this study (CAI = 0.0/h). - Moderate oxygen desaturation was noted during this study (Min O2 = 80.00%). - The patient snored with Moderate snoring volume. - No cardiac abnormalities were noted during this study. - Clinically significant periodic limb movements did not occur during sleep. No significant associated arousals.  DIAGNOSIS - Obstructive Sleep Apnea (327.23 [G47.33 ICD-10]) - Nocturnal Hypoxemia (327.26 [G47.36 ICD-10])  RECOMMENDATIONS - Therapeutic CPAP titration or a fitted oral appliance would usually be first choices. Other interventions will be based on clinical judgment. - Positional therapy avoiding supine position during sleep. - Avoid alcohol, sedatives and other CNS depressants that may worsen sleep apnea and disrupt normal sleep architecture. - Sleep hygiene should be reviewed to assess factors that may improve sleep quality. - Weight management and regular exercise should be initiated or continued if appropriate.  Deneise Lever Diplomate, American Board of Sleep Medicine  ELECTRONICALLY SIGNED ON:  08/12/2015, 3:28 PM [place] PH: [phone]   FX: [phone] [place]

## 2015-08-21 DIAGNOSIS — F320 Major depressive disorder, single episode, mild: Secondary | ICD-10-CM | POA: Diagnosis not present

## 2015-08-28 DIAGNOSIS — F320 Major depressive disorder, single episode, mild: Secondary | ICD-10-CM | POA: Diagnosis not present

## 2015-09-04 ENCOUNTER — Encounter: Payer: Self-pay | Admitting: Podiatry

## 2015-09-04 ENCOUNTER — Ambulatory Visit (INDEPENDENT_AMBULATORY_CARE_PROVIDER_SITE_OTHER): Payer: BLUE CROSS/BLUE SHIELD | Admitting: Podiatry

## 2015-09-04 DIAGNOSIS — Z79899 Other long term (current) drug therapy: Secondary | ICD-10-CM

## 2015-09-04 DIAGNOSIS — B351 Tinea unguium: Secondary | ICD-10-CM | POA: Diagnosis not present

## 2015-09-04 DIAGNOSIS — F320 Major depressive disorder, single episode, mild: Secondary | ICD-10-CM | POA: Diagnosis not present

## 2015-09-04 MED ORDER — TERBINAFINE HCL 250 MG PO TABS: 250.0000 mg | ORAL_TABLET | Freq: Every day | ORAL | Status: DC

## 2015-09-04 NOTE — Patient Instructions (Signed)

## 2015-09-04 NOTE — Progress Notes (Signed)
Patient ID: Richard [name], male   DOB: [birth year], 45 y.o.   MRN: [id number]  Subjective: 45 year-old male presents the op city discussed nail culture results. He denies any acute changes and last upon it. No pain or redness or swelling to the toenails.Denies any systemic complaints such as fevers, chills, nausea, vomiting. No acute changes since last appointment, and no other complaints at this time.   Objective: AAO x3, NAD DP/PT pulses palpable bilaterally, CRT less than 3 seconds Nails are hypertrophic, dystrophic, discolored. There is no tenderness to palpation along the nail borders there is no sign of redness or drainage. No areas of pinpoint bony tenderness or pain with vibratory sensation. MMT 5/5, ROM WNL. No edema, erythema, increase in warmth to bilateral lower extremities.  No open lesions or pre-ulcerative lesions.  No pain with calf compression, swelling, warmth, erythema  Assessment: Onychomycosis bilaterally  Plan: -All treatment options discussed with the patient including all alternatives, risks, complications.  -Nail culture results were discussed the patient which revealed onychomycosis. Dr. discussion of treatment options he elected to proceed with Lamisil understands side effects and risks of the medicine. Lamisil is ordered today as well as CBC and LFTs. Recommended not to start the medicine to call the results the blood work and he verbally understood this. Follow-up in 6 weeks for repeat blood were sooner if any issues are to arise. -Patient encouraged to call the office with any questions, concerns, change in symptoms.   Celesta Gentile, DPM

## 2015-09-05 DIAGNOSIS — Z79899 Other long term (current) drug therapy: Secondary | ICD-10-CM | POA: Diagnosis not present

## 2015-09-06 ENCOUNTER — Encounter: Payer: Self-pay | Admitting: Podiatry

## 2015-09-06 LAB — CBC WITH DIFFERENTIAL/PLATELET
BASOS: 0 %
Basophils Absolute: 0 10*3/uL (ref 0.0–0.2)
EOS (ABSOLUTE): 0.3 10*3/uL (ref 0.0–0.4)
EOS: 4 %
HEMATOCRIT: 45 % (ref 37.5–51.0)
Hemoglobin: 15.4 g/dL (ref 12.6–17.7)
IMMATURE GRANS (ABS): 0 10*3/uL (ref 0.0–0.1)
IMMATURE GRANULOCYTES: 0 %
Lymphocytes Absolute: 2.6 10*3/uL (ref 0.7–3.1)
Lymphs: 34 %
MCH: 29.9 pg (ref 26.6–33.0)
MCHC: 34.2 g/dL (ref 31.5–35.7)
MCV: 87 fL (ref 79–97)
MONOCYTES: 5 %
Monocytes Absolute: 0.4 10*3/uL (ref 0.1–0.9)
NEUTROS PCT: 57 %
Neutrophils Absolute: 4.5 10*3/uL (ref 1.4–7.0)
Platelets: 243 10*3/uL (ref 150–379)
RBC: 5.15 x10E6/uL (ref 4.14–5.80)
RDW: 13.9 % (ref 12.3–15.4)
WBC: 7.9 10*3/uL (ref 3.4–10.8)

## 2015-09-06 LAB — HEPATIC FUNCTION PANEL
ALBUMIN: 4.6 g/dL (ref 3.5–5.5)
ALT: 39 IU/L (ref 0–44)
AST: 23 IU/L (ref 0–40)
Alkaline Phosphatase: 63 IU/L (ref 39–117)
BILIRUBIN TOTAL: 0.6 mg/dL (ref 0.0–1.2)
BILIRUBIN, DIRECT: 0.14 mg/dL (ref 0.00–0.40)
Total Protein: 6.4 g/dL (ref 6.0–8.5)

## 2015-09-07 NOTE — Telephone Encounter (Addendum)
-----   Message from Trula Slade, DPM sent at 09/06/2015  5:04 PM EDT ----- Please let him know lab work is normal and can start lamisil. 09/07/2015-left message [phone] informing pt Dr. Jacqualyn Posey states his labs are normal and can start his medication.

## 2015-09-11 DIAGNOSIS — F320 Major depressive disorder, single episode, mild: Secondary | ICD-10-CM | POA: Diagnosis not present

## 2015-09-19 DIAGNOSIS — F320 Major depressive disorder, single episode, mild: Secondary | ICD-10-CM | POA: Diagnosis not present

## 2015-10-02 DIAGNOSIS — F320 Major depressive disorder, single episode, mild: Secondary | ICD-10-CM | POA: Diagnosis not present

## 2015-10-03 ENCOUNTER — Encounter (HOSPITAL_COMMUNITY): Payer: Self-pay

## 2015-10-03 ENCOUNTER — Other Ambulatory Visit: Payer: Self-pay | Admitting: Otolaryngology

## 2015-10-03 ENCOUNTER — Encounter (HOSPITAL_COMMUNITY)
Admission: RE | Admit: 2015-10-03 | Discharge: 2015-10-03 | Disposition: A | Discharge status: Home / Self Care | Payer: BLUE CROSS/BLUE SHIELD | Source: Ambulatory Visit | Attending: Otolaryngology | Admitting: Otolaryngology

## 2015-10-03 DIAGNOSIS — Z01812 Encounter for preprocedural laboratory examination: Secondary | ICD-10-CM | POA: Insufficient documentation

## 2015-10-03 DIAGNOSIS — G4733 Obstructive sleep apnea (adult) (pediatric): Secondary | ICD-10-CM | POA: Diagnosis not present

## 2015-10-03 DIAGNOSIS — J3489 Other specified disorders of nose and nasal sinuses: Secondary | ICD-10-CM | POA: Diagnosis not present

## 2015-10-03 DIAGNOSIS — J342 Deviated nasal septum: Secondary | ICD-10-CM | POA: Diagnosis not present

## 2015-10-03 HISTORY — DX: Other specified health status: Z78.9

## 2015-10-03 LAB — CBC
HEMATOCRIT: 42.9 % (ref 39.0–52.0)
HEMOGLOBIN: 14.3 g/dL (ref 13.0–17.0)
MCH: 29.1 pg (ref 26.0–34.0)
MCHC: 33.3 g/dL (ref 30.0–36.0)
MCV: 87.4 fL (ref 78.0–100.0)
Platelets: 216 10*3/uL (ref 150–400)
RBC: 4.91 MIL/uL (ref 4.22–5.81)
RDW: 13.3 % (ref 11.5–15.5)
WBC: 9 10*3/uL (ref 4.0–10.5)

## 2015-10-03 NOTE — Pre-Procedure Instructions (Signed)
Dayden Mcgann  10/03/2015      [place], [place] [address] [place] [address] Phone: [phone] Fax: [phone]    Your procedure is scheduled on Wed, July 26 @ 8:30 AM  Report to [facility] Admitting at 6:30 AM  Call this number if you have problems the morning of surgery:  [phone]   Remember:  Do not eat food or drink liquids after midnight.               No Goody's,BC's,Aleve,Aspirin,Ibuprofen,Motrin,Advil,Fish Oil, or any Herbal Medications.    Do not wear jewelry.  Do not wear lotions, powders, or colognes.     Men may shave face and neck.  Do not bring valuables to the hospital.  [facility] is not responsible for any belongings or valuables.  Contacts, dentures or bridgework may not be worn into surgery.  Leave your suitcase in the car.  After surgery it may be brought to your room.  For patients admitted to the hospital, discharge time will be determined by your treatment team.  Patients discharged the day of surgery will not be allowed to drive home.    [facility]  Before surgery, you can play an important role.  Because skin is not sterile, your skin needs to be as free of germs as possible.  You can reduce the number of germs on you skin by washing with CHG (chlorahexidine gluconate) soap before surgery.  CHG is an antiseptic cleaner which kills germs and bonds with the skin to continue killing germs even after washing.  Please DO NOT use if you have an allergy to CHG or antibacterial soaps.  If your skin becomes reddened/irritated stop using the CHG and inform your nurse when you arrive at Short Stay.  Do not shave (including legs and underarms) for at least 48 hours prior to the first CHG shower.  You may shave your face.  Please follow these instructions carefully:   1.  Shower with CHG Soap the night before surgery and the                                 morning of Surgery.  2.  If you choose to wash your hair, wash your hair first as usual with your       normal shampoo.  3.  After you shampoo, rinse your hair and body thoroughly to remove the                      Shampoo.  4.  Use CHG as you would any other liquid soap.  You can apply chg directly       to the skin and wash gently with scrungie or a clean washcloth.  5.  Apply the CHG Soap to your body ONLY FROM THE NECK DOWN.        Do not use on open wounds or open sores.  Avoid contact with your eyes,       ears, mouth and genitals (private parts).  Wash genitals (private parts)       with your normal soap.  6.  Wash thoroughly, paying special attention to the area where your surgery        will be performed.  7.  Thoroughly rinse your body with warm water from the  neck down.  8.  DO NOT shower/wash with your normal soap after using and rinsing off       the CHG Soap.  9.  Pat yourself dry with a clean towel.            10.  Wear clean pajamas.            11.  Place clean sheets on your bed the night of your first shower and do not        sleep with pets.  Day of Surgery  Do not apply any lotions/deoderants the morning of surgery.  Please wear clean clothes to the hospital/surgery center.     Please read over the following fact sheets that you were given. Pain Booklet

## 2015-10-03 NOTE — Progress Notes (Addendum)
Cardiologist denies  Sees NP Alma Friendly with [facility]  Echo denies  Stress test 8+ yrs ago-done d/t weight loss program  Heart cath denies  EKG denies  CXR denies

## 2015-10-03 NOTE — H&P (Signed)
Otolaryngology Clinic Note  HPI:   Richard [name] is a 45 y.o. male patient of Kirtland Bouchard, MD for preop evaluation.  He is hoping to breathe better through his nose and we are recommending septoplasty and reduction of turbinates.  He also would like to have improvement of his snoring.  Recent home sleep apnea hypotony index of 9/h.  We are making arrangements to do an LAUP.  I discussed with several pieces of the surgery including risks and complications.  Questions were answered and informed consent was obtained.  I discussed nasal and oral hygiene measures and gave him written instructions.  I gave him prescriptions for oxycodone liquid for pain relief, and cephalexin liquid antibiotic.  He will stay overnight one night in the hospital.  I will remove the nasal packing the following morning and discharge him to his home.  9 days later we will remove the internal septal splints.  I talked about advancement of diet and activity including return to activities and work. PMH/Meds/All/SocHx/FamHx/ROS:    PastMedicalHistory       Past Medical History:  Diagnosis Date  . Primary snoring        PastSurgicalHistory        Past Surgical History:  Procedure Laterality Date  . GALLBLADDER SURGERY    . TOOTH EXTRACTION        No family history of bleeding disorders, wound healing problems or difficulty with anesthesia.    SocialHistory   Social History        Social History  . Marital status: Married    Spouse name: N/A  . Number of children: N/A  . Years of education: N/A      Occupational History  . Not on file.        Social History Main Topics  . Smoking status: Light Tobacco Smoker    Types: Cigars  . Smokeless tobacco: Current User  . Alcohol use Not on file  . Drug use: Not on file  . Sexual activity: Not on file       Other Topics Concern  . Not on file   Social History Narrative       Current Outpatient Prescriptions:  .   ketoconazole (NIZORAL) 2 % cream, Apply topically., Disp: , Rfl:  .  terbinafine HCl (LAMISIL) 250 mg tablet, Take by mouth., Disp: , Rfl:  .  urea (CARMOL) 40 % Crea, Apply topically., Disp: , Rfl:  .  cephALEXin (KEFLEX) 250 mg/5 mL suspension, Take 10 mLs (500 mg total) by mouth 4 times daily for 10 days., Disp: 400 mL, Rfl: 0 .  oxyCODONE (ROXICODONE) 5 mg/5 mL solution, Take 5-10 mLs (5-10 mg total) by mouth every 4 (four) hours as needed for Severe pain., Disp: 300 mL, Rfl: 0  A complete ROS was performed with pertinent positives/negatives noted in the HPI. The remainder of the ROS are negative.   Physical Exam:    There were no vitals taken for this visit. He is stocky and healthy.  Mental status is appropriate.  Ears are clear.  He has a leftward septal deviation with bulky turbinates.  Oral cavity shows teeth in good repair with mandible and maxilla of normal configuration.  He has a long thin soft palate and uvula and 1+ tonsils.  Neck unremarkable. Lungs: Clear to auscultation Heart: Regular rate and rhythm without murmurs Abdomen: Soft, active Extremities: Normal configuration Neurologic: Symmetric, grossly intact.    With informed consent, I anesthetized the surface of the soft  palate with Hurricaine spray, then applied a single Niger ink puncture tattoo at the muscular junction in the standard fashion.  He tolerated this well.   Impression & Plans:   Nasal septal deviation with hypertrophic turbinates.  Mild-moderate obstructive sleep apnea.  Plan: We will proceed with septoplasty, reduction of turbinates, and LAUP.  Part or all of this encounter was generated using voice transcription/voice recognition  software and imported.   Lilyan Gilford, MD  [phone]

## 2015-10-04 DIAGNOSIS — L578 Other skin changes due to chronic exposure to nonionizing radiation: Secondary | ICD-10-CM | POA: Diagnosis not present

## 2015-10-04 DIAGNOSIS — L812 Freckles: Secondary | ICD-10-CM | POA: Diagnosis not present

## 2015-10-04 DIAGNOSIS — Z808 Family history of malignant neoplasm of other organs or systems: Secondary | ICD-10-CM | POA: Diagnosis not present

## 2015-10-04 DIAGNOSIS — L720 Epidermal cyst: Secondary | ICD-10-CM | POA: Diagnosis not present

## 2015-10-09 DIAGNOSIS — F320 Major depressive disorder, single episode, mild: Secondary | ICD-10-CM | POA: Diagnosis not present

## 2015-10-09 MED ORDER — DEXTROSE 5 % IV SOLN
3.0000 g | INTRAVENOUS | Status: AC
  Administered 2015-10-10: 3 g via INTRAVENOUS
  Filled 2015-10-09: qty 3000

## 2015-10-09 MED ORDER — DEXAMETHASONE SODIUM PHOSPHATE 10 MG/ML IJ SOLN
10.0000 mg | INTRAMUSCULAR | Status: AC
  Administered 2015-10-10: 10 mg via INTRAVENOUS
  Filled 2015-10-09: qty 1

## 2015-10-09 MED ORDER — OXYMETAZOLINE HCL 0.05 % NA SOLN
2.0000 | NASAL | Status: AC | PRN
  Administered 2015-10-10 (×2): 2 via NASAL
  Filled 2015-10-09: qty 15

## 2015-10-09 NOTE — Anesthesia Preprocedure Evaluation (Signed)
Anesthesia Evaluation  Patient identified by MRN, date of birth, ID band Patient awake    Reviewed: Allergy & Precautions, NPO status , Patient's Chart, lab work & pertinent test results  History of Anesthesia Complications Negative for: history of anesthetic complications  Airway Mallampati: II  TM Distance: >3 FB Neck ROM: Full    Dental  (+) Dental Advisory Given   Pulmonary sleep apnea (mild, no CPAP) , Current Smoker,    breath sounds clear to auscultation       Cardiovascular negative cardio ROS   Rhythm:Regular Rate:Normal     Neuro/Psych negative neurological ROS     GI/Hepatic negative GI ROS, Neg liver ROS,   Endo/Other  Morbid obesity  Renal/GU negative Renal ROS     Musculoskeletal negative musculoskeletal ROS (+)   Abdominal (+) + obese,   Peds  Hematology   Anesthesia Other Findings   Reproductive/Obstetrics                            Anesthesia Physical Anesthesia Plan  ASA: III  Anesthesia Plan: General   Post-op Pain Management:    Induction: Intravenous  Airway Management Planned: Oral ETT  Additional Equipment:   Intra-op Plan:   Post-operative Plan: Extubation in OR  Informed Consent: I have reviewed the patients History and Physical, chart, labs and discussed the procedure including the risks, benefits and alternatives for the proposed anesthesia with the patient or authorized representative who has indicated his/her understanding and acceptance.   Dental advisory given  Plan Discussed with: CRNA and Surgeon  Anesthesia Plan Comments: (Plan routine monitors, GETA)        Anesthesia Quick Evaluation

## 2015-10-10 ENCOUNTER — Encounter (HOSPITAL_COMMUNITY)
Admission: RE | Disposition: A | Discharge status: Home / Self Care | Payer: Self-pay | Source: Ambulatory Visit | Attending: Otolaryngology

## 2015-10-10 ENCOUNTER — Encounter (HOSPITAL_COMMUNITY): Payer: Self-pay | Admitting: *Deleted

## 2015-10-10 ENCOUNTER — Ambulatory Visit (HOSPITAL_COMMUNITY): Payer: BLUE CROSS/BLUE SHIELD | Admitting: Certified Registered Nurse Anesthetist

## 2015-10-10 ENCOUNTER — Observation Stay (HOSPITAL_COMMUNITY)
Admission: RE | Admit: 2015-10-10 | Discharge: 2015-10-11 | Disposition: A | Discharge status: Home / Self Care | Payer: BLUE CROSS/BLUE SHIELD | Source: Ambulatory Visit | Attending: Otolaryngology | Admitting: Otolaryngology

## 2015-10-10 DIAGNOSIS — G4733 Obstructive sleep apnea (adult) (pediatric): Secondary | ICD-10-CM | POA: Diagnosis not present

## 2015-10-10 DIAGNOSIS — F17210 Nicotine dependence, cigarettes, uncomplicated: Secondary | ICD-10-CM | POA: Diagnosis not present

## 2015-10-10 DIAGNOSIS — G4730 Sleep apnea, unspecified: Secondary | ICD-10-CM | POA: Diagnosis present

## 2015-10-10 DIAGNOSIS — Z6836 Body mass index (BMI) 36.0-36.9, adult: Secondary | ICD-10-CM | POA: Insufficient documentation

## 2015-10-10 DIAGNOSIS — J343 Hypertrophy of nasal turbinates: Secondary | ICD-10-CM | POA: Diagnosis not present

## 2015-10-10 DIAGNOSIS — J342 Deviated nasal septum: Secondary | ICD-10-CM | POA: Diagnosis not present

## 2015-10-10 DIAGNOSIS — J3489 Other specified disorders of nose and nasal sinuses: Secondary | ICD-10-CM | POA: Diagnosis not present

## 2015-10-10 HISTORY — DX: Sleep apnea, unspecified: G47.30

## 2015-10-10 HISTORY — PX: NASAL SEPTOPLASTY W/ TURBINOPLASTY: SHX2070

## 2015-10-10 HISTORY — PX: UVULECTOMY: SHX2631

## 2015-10-10 SURGERY — SEPTOPLASTY, NOSE, WITH NASAL TURBINATE REDUCTION
Anesthesia: General

## 2015-10-10 MED ORDER — ROCURONIUM BROMIDE 10 MG/ML (PF) SYRINGE
PREFILLED_SYRINGE | INTRAVENOUS | Status: DC | PRN
  Administered 2015-10-10: 20 mg via INTRAVENOUS
  Administered 2015-10-10: 50 mg via INTRAVENOUS

## 2015-10-10 MED ORDER — FENTANYL CITRATE (PF) 250 MCG/5ML IJ SOLN
INTRAMUSCULAR | Status: AC
  Filled 2015-10-10: qty 5

## 2015-10-10 MED ORDER — CEPHALEXIN 250 MG/5ML PO SUSR
500.0000 mg | Freq: Two times a day (BID) | ORAL | Status: DC
  Administered 2015-10-10 – 2015-10-11 (×3): 500 mg via ORAL
  Filled 2015-10-10 (×3): qty 10

## 2015-10-10 MED ORDER — DEXTROSE-NACL 5-0.45 % IV SOLN
INTRAVENOUS | Status: DC
  Administered 2015-10-10: 12:00:00 via INTRAVENOUS
  Administered 2015-10-11: 1000 mL via INTRAVENOUS

## 2015-10-10 MED ORDER — BACITRACIN ZINC 500 UNIT/GM EX OINT
TOPICAL_OINTMENT | CUTANEOUS | Status: AC
  Filled 2015-10-10: qty 28.35

## 2015-10-10 MED ORDER — LACTATED RINGERS IV SOLN
INTRAVENOUS | Status: DC | PRN
  Administered 2015-10-10 (×2): via INTRAVENOUS

## 2015-10-10 MED ORDER — OXYMETAZOLINE HCL 0.05 % NA SOLN
NASAL | Status: DC | PRN
  Administered 2015-10-10: 1 via TOPICAL

## 2015-10-10 MED ORDER — LIDOCAINE 2% (20 MG/ML) 5 ML SYRINGE
INTRAMUSCULAR | Status: AC
  Filled 2015-10-10: qty 5

## 2015-10-10 MED ORDER — WHITE PETROLATUM GEL
Status: AC
  Administered 2015-10-10: 22:00:00
  Filled 2015-10-10: qty 1

## 2015-10-10 MED ORDER — LIDOCAINE-EPINEPHRINE 1 %-1:100000 IJ SOLN
INTRAMUSCULAR | Status: AC
  Filled 2015-10-10: qty 1

## 2015-10-10 MED ORDER — ONDANSETRON HCL 4 MG/2ML IJ SOLN
INTRAMUSCULAR | Status: DC | PRN
  Administered 2015-10-10: 4 mg via INTRAVENOUS

## 2015-10-10 MED ORDER — NEOSTIGMINE METHYLSULFATE 5 MG/5ML IV SOSY
PREFILLED_SYRINGE | INTRAVENOUS | Status: DC | PRN
  Administered 2015-10-10: 4 mg via INTRAVENOUS

## 2015-10-10 MED ORDER — EPHEDRINE SULFATE-NACL 50-0.9 MG/10ML-% IV SOSY
PREFILLED_SYRINGE | INTRAVENOUS | Status: DC | PRN
  Administered 2015-10-10: 5 mg via INTRAVENOUS

## 2015-10-10 MED ORDER — MEPERIDINE HCL 25 MG/ML IJ SOLN: 6.2500 mg | INTRAMUSCULAR | Status: DC | PRN

## 2015-10-10 MED ORDER — DEXAMETHASONE SODIUM PHOSPHATE 10 MG/ML IJ SOLN
INTRAMUSCULAR | Status: AC
  Filled 2015-10-10: qty 1

## 2015-10-10 MED ORDER — MIDAZOLAM HCL 2 MG/2ML IJ SOLN: 0.5000 mg | Freq: Once | INTRAMUSCULAR | Status: DC | PRN

## 2015-10-10 MED ORDER — ROCURONIUM BROMIDE 50 MG/5ML IV SOLN
INTRAVENOUS | Status: AC
  Filled 2015-10-10: qty 1

## 2015-10-10 MED ORDER — IBUPROFEN 100 MG/5ML PO SUSP
400.0000 mg | Freq: Four times a day (QID) | ORAL | Status: DC | PRN
  Filled 2015-10-10: qty 20

## 2015-10-10 MED ORDER — ACETAMINOPHEN 160 MG/5ML PO SOLN: 325.0000 mg | ORAL | Status: DC | PRN

## 2015-10-10 MED ORDER — ONDANSETRON HCL 4 MG/2ML IJ SOLN
INTRAMUSCULAR | Status: AC
  Filled 2015-10-10: qty 2

## 2015-10-10 MED ORDER — BACITRACIN ZINC 500 UNIT/GM EX OINT
TOPICAL_OINTMENT | CUTANEOUS | Status: DC | PRN
  Administered 2015-10-10: 1 via TOPICAL

## 2015-10-10 MED ORDER — PROPOFOL 10 MG/ML IV BOLUS
INTRAVENOUS | Status: AC
  Filled 2015-10-10: qty 20

## 2015-10-10 MED ORDER — LIDOCAINE 2% (20 MG/ML) 5 ML SYRINGE
INTRAMUSCULAR | Status: DC | PRN
  Administered 2015-10-10: 20 mg via INTRAVENOUS

## 2015-10-10 MED ORDER — HYDROMORPHONE HCL 1 MG/ML IJ SOLN
INTRAMUSCULAR | Status: AC
  Filled 2015-10-10: qty 1

## 2015-10-10 MED ORDER — HYDROMORPHONE HCL 1 MG/ML IJ SOLN
0.2500 mg | INTRAMUSCULAR | Status: DC | PRN
  Administered 2015-10-10: 0.5 mg via INTRAVENOUS
  Administered 2015-10-10 (×2): 0.25 mg via INTRAVENOUS

## 2015-10-10 MED ORDER — 0.9 % SODIUM CHLORIDE (POUR BTL) OPTIME
TOPICAL | Status: DC | PRN
  Administered 2015-10-10: 1000 mL

## 2015-10-10 MED ORDER — LIDOCAINE-EPINEPHRINE 1 %-1:100000 IJ SOLN
INTRAMUSCULAR | Status: DC | PRN
  Administered 2015-10-10: 18 mL

## 2015-10-10 MED ORDER — GLYCOPYRROLATE 0.2 MG/ML IV SOSY
PREFILLED_SYRINGE | INTRAVENOUS | Status: DC | PRN
  Administered 2015-10-10: .6 mg via INTRAVENOUS

## 2015-10-10 MED ORDER — CHLORHEXIDINE GLUCONATE 0.12 % MT SOLN
5.0000 mL | Freq: Four times a day (QID) | OROMUCOSAL | Status: DC
  Administered 2015-10-10 (×3): 5 mL via OROMUCOSAL
  Filled 2015-10-10 (×3): qty 15

## 2015-10-10 MED ORDER — PROPOFOL 10 MG/ML IV BOLUS
INTRAVENOUS | Status: DC | PRN
  Administered 2015-10-10: 300 mg via INTRAVENOUS

## 2015-10-10 MED ORDER — OXYCODONE HCL 5 MG/5ML PO SOLN: 10.0000 mg | Freq: Once | ORAL | Status: DC

## 2015-10-10 MED ORDER — FENTANYL CITRATE (PF) 100 MCG/2ML IJ SOLN
INTRAMUSCULAR | Status: DC | PRN
  Administered 2015-10-10: 250 ug via INTRAVENOUS
  Administered 2015-10-10: 25 ug via INTRAVENOUS
  Administered 2015-10-10: 50 ug via INTRAVENOUS

## 2015-10-10 MED ORDER — MIDAZOLAM HCL 2 MG/2ML IJ SOLN
INTRAMUSCULAR | Status: AC
  Filled 2015-10-10: qty 2

## 2015-10-10 MED ORDER — PROMETHAZINE HCL 25 MG/ML IJ SOLN
INTRAMUSCULAR | Status: AC
  Filled 2015-10-10: qty 1

## 2015-10-10 MED ORDER — MORPHINE SULFATE (PF) 2 MG/ML IV SOLN
2.0000 mg | INTRAVENOUS | Status: DC | PRN
  Administered 2015-10-10 – 2015-10-11 (×5): 4 mg via INTRAVENOUS
  Filled 2015-10-10 (×5): qty 2

## 2015-10-10 MED ORDER — OXYCODONE HCL 5 MG/5ML PO SOLN
5.0000 mg | ORAL | Status: DC | PRN
  Administered 2015-10-10 – 2015-10-11 (×3): 10 mg via ORAL
  Filled 2015-10-10 (×3): qty 10

## 2015-10-10 MED ORDER — PROMETHAZINE HCL 25 MG/ML IJ SOLN
6.2500 mg | INTRAMUSCULAR | Status: DC | PRN
  Administered 2015-10-10: 12.5 mg via INTRAVENOUS

## 2015-10-10 SURGICAL SUPPLY — 42 items
ATTRACTOMAT 16X20 MAGNETIC DRP (DRAPES) ×3 IMPLANT
BANDAGE EYE OVAL (MISCELLANEOUS) ×6 IMPLANT
BLADE INF TURB ROT M4 2 5PK (BLADE) IMPLANT
BLADE SURG 15 STRL LF DISP TIS (BLADE) ×2 IMPLANT
BLADE SURG 15 STRL SS (BLADE) ×1
BLADE TRICUT ROTATE M4 4 5PK (BLADE) IMPLANT
CANISTER SUCTION 2500CC (MISCELLANEOUS) ×3 IMPLANT
COAGULATOR SUCT SWTCH 10FR 6 (ELECTROSURGICAL) ×3 IMPLANT
CRADLE DONUT ADULT HEAD (MISCELLANEOUS) ×3 IMPLANT
DRAPE PROXIMA HALF (DRAPES) ×3 IMPLANT
DRESSING TELFA 8X10 (GAUZE/BANDAGES/DRESSINGS) IMPLANT
DRSG NASOPORE 8CM (GAUZE/BANDAGES/DRESSINGS) IMPLANT
DRSG TELFA 3X8 NADH (GAUZE/BANDAGES/DRESSINGS) ×3 IMPLANT
ELECT REM PT RETURN 9FT ADLT (ELECTROSURGICAL) ×3
ELECTRODE REM PT RTRN 9FT ADLT (ELECTROSURGICAL) ×2 IMPLANT
GAUZE PACKING FOLDED 2  STR (GAUZE/BANDAGES/DRESSINGS) ×1
GAUZE PACKING FOLDED 2 STR (GAUZE/BANDAGES/DRESSINGS) ×2 IMPLANT
GAUZE SPONGE 2X2 8PLY STRL LF (GAUZE/BANDAGES/DRESSINGS) IMPLANT
GEL ULTRASOUND 20GR AQUASONIC (MISCELLANEOUS) IMPLANT
GLOVE ECLIPSE 8.0 STRL XLNG CF (GLOVE) ×3 IMPLANT
GOWN STRL REUS W/ TWL LRG LVL3 (GOWN DISPOSABLE) ×2 IMPLANT
GOWN STRL REUS W/ TWL XL LVL3 (GOWN DISPOSABLE) ×2 IMPLANT
GOWN STRL REUS W/TWL LRG LVL3 (GOWN DISPOSABLE) ×1
GOWN STRL REUS W/TWL XL LVL3 (GOWN DISPOSABLE) ×1
KIT BASIN OR (CUSTOM PROCEDURE TRAY) ×3 IMPLANT
KIT ROOM TURNOVER OR (KITS) ×3 IMPLANT
NEEDLE HYPO 25GX1X1/2 BEV (NEEDLE) ×3 IMPLANT
NEEDLE SPNL 25GX3.5 QUINCKE BL (NEEDLE) ×3 IMPLANT
NS IRRIG 1000ML POUR BTL (IV SOLUTION) ×3 IMPLANT
PAD ARMBOARD 7.5X6 YLW CONV (MISCELLANEOUS) ×6 IMPLANT
PATTIES SURGICAL .5 X3 (DISPOSABLE) ×3 IMPLANT
SHEET SIL 040 (INSTRUMENTS) ×3 IMPLANT
SPONGE GAUZE 2X2 STER 10/PKG (GAUZE/BANDAGES/DRESSINGS)
SUT CHROMIC 4 0 P 3 18 (SUTURE) ×3 IMPLANT
SUT ETHILON 3 0 FSL (SUTURE) ×3 IMPLANT
SUT ETHILON 3 0 PS 1 (SUTURE) IMPLANT
SUT PDS AB 4-0 P3 18 (SUTURE) ×3 IMPLANT
SUT PDS AB 4-0 SH 27 (SUTURE) IMPLANT
SUT VIC AB 3-0 FS2 27 (SUTURE) ×3 IMPLANT
SUT VIC AB 4-0 SH 18 (SUTURE) ×3 IMPLANT
TRAY ENT MC OR (CUSTOM PROCEDURE TRAY) ×3 IMPLANT
TUBE CONNECTING 12X1/4 (SUCTIONS) ×3 IMPLANT

## 2015-10-10 NOTE — Transfer of Care (Signed)
Immediate Anesthesia Transfer of Care Note  Patient: Richard [name]  Procedure(s) Performed: Procedure(s): NASAL SEPTOPLASTY WITH BILATERAL TURBINATE REDUCTION (Bilateral) UVULOPLASTY, LASER ASSISTED (N/A)  Patient Location: PACU  Anesthesia Type:General  Level of Consciousness: awake, alert  and oriented  Airway & Oxygen Therapy: Patient Spontanous Breathing and Patient connected to face mask oxygen  Post-op Assessment: Report given to RN and Post -op Vital signs reviewed and stable  Post vital signs: Reviewed and stable  Last Vitals:  Vitals:   10/10/15 0724  BP: 119/61  Pulse: (!) 57  Resp: 20  Temp: 36.7 C    Last Pain:  Vitals:   10/10/15 0724  TempSrc: Oral         Complications: No apparent anesthesia complications

## 2015-10-10 NOTE — Anesthesia Procedure Notes (Signed)
Procedure Name: Intubation Date/Time: 10/10/2015 8:55 AM Performed by: Merdis Delay Pre-anesthesia Checklist: Patient identified Patient Re-evaluated:Patient Re-evaluated prior to inductionOxygen Delivery Method: Circle system utilized Preoxygenation: Pre-oxygenation with 100% oxygen Intubation Type: IV induction Ventilation: Mask ventilation without difficulty and Oral airway inserted - appropriate to patient size Laryngoscope Size: Sabra Heck and 1 Grade View: Grade I Tube type: Oral Tube size: 7.5 mm Number of attempts: 1 Airway Equipment and Method: Stylet Placement Confirmation: ETT inserted through vocal cords under direct vision,  positive ETCO2,  CO2 detector and breath sounds checked- equal and bilateral Secured at: 22 cm Tube secured with: Tape Dental Injury: Teeth and Oropharynx as per pre-operative assessment  Comments: Performed by Neville Route srna

## 2015-10-10 NOTE — Anesthesia Postprocedure Evaluation (Signed)
Anesthesia Post Note  Patient: Yuan Carrozza  Procedure(s) Performed: Procedure(s) (LRB): NASAL SEPTOPLASTY WITH BILATERAL TURBINATE REDUCTION (Bilateral) UVULOPLASTY, LASER ASSISTED (N/A)  Patient location during evaluation: PACU Anesthesia Type: General Level of consciousness: awake and alert, oriented and patient cooperative Pain management: pain level controlled Vital Signs Assessment: post-procedure vital signs reviewed and stable Respiratory status: spontaneous breathing, nonlabored ventilation and respiratory function stable Cardiovascular status: blood pressure returned to baseline and stable Postop Assessment: no signs of nausea or vomiting Anesthetic complications: no    Last Vitals:  Vitals:   10/10/15 1130 10/10/15 1140  BP:    Pulse: 61   Resp: 13   Temp:  36.7 C    Last Pain:  Vitals:   10/10/15 1140  TempSrc:   PainSc: Asleep                 Jazzlene Huot,E. Lalitha Ilyas

## 2015-10-10 NOTE — Interval H&P Note (Signed)
History and Physical Interval Note:  10/10/2015 8:33 AM  Richard [name]  has presented today for surgery, with the diagnosis of nasal obstruction and obstructive sleep apnea  The various methods of treatment have been discussed with the patient and family. After consideration of risks, benefits and other options for treatment, the patient has consented to  Procedure(s): NASAL SEPTOPLASTY WITH BILATERAL TURBINATE REDUCTION (Bilateral) UVULOPLASTY, LASER ASSISTED (N/A) as a surgical intervention .  The patient's history has been re-reviewed, patient re-examined, no change in status, stable for surgery.  I have re-reviewed the patient's chart and labs.  Questions were answered to the patient's satisfaction.     [name]

## 2015-10-10 NOTE — H&P (View-Only) (Signed)
Otolaryngology Clinic Note  HPI:   Richard [name] is a 45 y.o. male patient of Kirtland Bouchard, MD for preop evaluation.  He is hoping to breathe better through his nose and we are recommending septoplasty and reduction of turbinates.  He also would like to have improvement of his snoring.  Recent home sleep apnea hypotony index of 9/h.  We are making arrangements to do an LAUP.  I discussed with several pieces of the surgery including risks and complications.  Questions were answered and informed consent was obtained.  I discussed nasal and oral hygiene measures and gave him written instructions.  I gave him prescriptions for oxycodone liquid for pain relief, and cephalexin liquid antibiotic.  He will stay overnight one night in the hospital.  I will remove the nasal packing the following morning and discharge him to his home.  9 days later we will remove the internal septal splints.  I talked about advancement of diet and activity including return to activities and work. PMH/Meds/All/SocHx/FamHx/ROS:    PastMedicalHistory       Past Medical History:  Diagnosis Date  . Primary snoring        PastSurgicalHistory        Past Surgical History:  Procedure Laterality Date  . GALLBLADDER SURGERY    . TOOTH EXTRACTION        No family history of bleeding disorders, wound healing problems or difficulty with anesthesia.    SocialHistory   Social History        Social History  . Marital status: Married    Spouse name: N/A  . Number of children: N/A  . Years of education: N/A      Occupational History  . Not on file.        Social History Main Topics  . Smoking status: Light Tobacco Smoker    Types: Cigars  . Smokeless tobacco: Current User  . Alcohol use Not on file  . Drug use: Not on file  . Sexual activity: Not on file       Other Topics Concern  . Not on file   Social History Narrative       Current Outpatient Prescriptions:  .   ketoconazole (NIZORAL) 2 % cream, Apply topically., Disp: , Rfl:  .  terbinafine HCl (LAMISIL) 250 mg tablet, Take by mouth., Disp: , Rfl:  .  urea (CARMOL) 40 % Crea, Apply topically., Disp: , Rfl:  .  cephALEXin (KEFLEX) 250 mg/5 mL suspension, Take 10 mLs (500 mg total) by mouth 4 times daily for 10 days., Disp: 400 mL, Rfl: 0 .  oxyCODONE (ROXICODONE) 5 mg/5 mL solution, Take 5-10 mLs (5-10 mg total) by mouth every 4 (four) hours as needed for Severe pain., Disp: 300 mL, Rfl: 0  A complete ROS was performed with pertinent positives/negatives noted in the HPI. The remainder of the ROS are negative.   Physical Exam:    There were no vitals taken for this visit. He is stocky and healthy.  Mental status is appropriate.  Ears are clear.  He has a leftward septal deviation with bulky turbinates.  Oral cavity shows teeth in good repair with mandible and maxilla of normal configuration.  He has a long thin soft palate and uvula and 1+ tonsils.  Neck unremarkable. Lungs: Clear to auscultation Heart: Regular rate and rhythm without murmurs Abdomen: Soft, active Extremities: Normal configuration Neurologic: Symmetric, grossly intact.    With informed consent, I anesthetized the surface of the soft  palate with Hurricaine spray, then applied a single [place] ink puncture tattoo at the muscular junction in the standard fashion.  He tolerated this well.   Impression & Plans:   Nasal septal deviation with hypertrophic turbinates.  Mild-moderate obstructive sleep apnea.  Plan: We will proceed with septoplasty, reduction of turbinates, and LAUP.  Part or all of this encounter was generated using voice transcription/voice recognition  software and imported.   Lilyan Gilford, MD  [phone]

## 2015-10-10 NOTE — Discharge Instructions (Signed)
Septoplasty, Care After Refer to this sheet in the next few weeks. These instructions provide you with information about caring for yourself after your procedure. Your health care provider may also give you more specific instructions. Your treatment has been planned according to current medical practices, but problems sometimes occur. Call your health care provider if you have any problems or questions after your procedure. WHAT TO EXPECT AFTER THE PROCEDURE After your procedure, it is typical to have the following:  Mild headache.  Stuffy nose.  Feeling of fullness in your ears.  Bloody fluid coming from your nose. HOME CARE INSTRUCTIONS Medicines  If you were prescribed an antibiotic medicine, finish it all even if you start to feel better.  Take medicines only as directed by your health care provider.  You may be directed to clean your nostrils with a saline nasal spray. This will help to clear the crusts and blood clots in your nose. The solution can be found as an over-the-counter solution or made at home as directed by your health care provider.  You may need to take laxatives or stool softeners as directed by your health care provider. What to Avoid  Do not blow your nose for 2 weeks after surgery or as directed by your health care provider.  Do not lift anything heavier than 10 lb (4.5 kg) for 2 weeks or as directed by your health care provider.  Avoid strenuous activities that can cause nosebleeds for 2 weeks. These include activities such as running or playing sports.  Avoid very hot or steamy showers for several days after surgery or as directed by your health care provider. Eating and Drinking  Avoid eating hot and spicy foods for several days after surgery or as directed by your health care provider.  Eat plenty of fiber to keep your stools soft, especially while you are taking pain medicine. This helps you to avoid straining, which can cause a nosebleed.  Drink  enough fluid to keep your urine clear or pale yellow. General Instructions  Raise (elevate) your head while you are lying down.  Keep all follow-up visits as directed by your health care provider. This is important.  If you have nasal splints, follow your health care provider's instructions about removal.  If your nose was packed with gauze, follow your health care provider's instructions about removal. SEEK MEDICAL CARE IF:  You develop swelling or increased pain in your nose.  You have yellowish-white fluid (pus) coming from your nose.  You have severe diarrhea.  You have ongoing (persistent) nausea.  You cannot breathe through your nose.  You have a fever. SEEK IMMEDIATE MEDICAL CARE IF:  You are short of breath.  You feel dizzy or you faint.  You have vision changes.  You are bleeding heavily from your nose.  You are vomiting.  You have a severe headache or a stiff neck.   This information is not intended to replace advice given to you by your health care provider. Make sure you discuss any questions you have with your health care provider.   Document Released: 03/03/2005 Document Revised: 03/24/2014 Document Reviewed: 10/12/2013 Elsevier Interactive Patient Education 2016 [place], Adult A tonsillectomy is a surgery to remove your tonsils. Tonsils are lymph tissues at the back and upper part of your throat. Because tonsils can collect debris, they can become infected. Tonsillectomy often is done when nonsurgical treatments have been unsuccessful in resolving problems with tonsils.  LET YOUR  HEALTH CARE PROVIDER KNOW ABOUT:  Any allergies you have.  All medicines you are taking, including vitamins, herbs, eye drops, creams, and over-the-counter medicines, especially those containing aspirin or ibuprofen.  Previous problems you or members of your family have had with the use of anesthetics.  Any blood disorders you have.  Previous surgeries  you have had.  Medical conditions you have.  Recent cough or fever you have had. RISKS AND COMPLICATIONS Generally, tonsillectomy is a safe procedure. However, as with any procedure, problems can occur. Possible problems include:  Bleeding.  Infection.  Scarring.  Changes in your sense of taste.  Changes in your voice.  Changes in swallowing. BEFORE THE PROCEDURE  Do not take any aspirin, ibuprofen, or any medicines that may contain these agents for 2 weeks before the procedure.  Do not eat or drink after midnight the night before the procedure. PROCEDURE   You will be given a medicine that makes you go to sleep (general anesthetic).  A device will be placed inside of your mouth that presses your tongue down so that the tonsils at the back of your throat can be removed without cuts on the outside of your neck or throat.  Your tonsils will typically be removed with a device called an electrocautery, which will cut your tonsils out and then shrink the surrounding blood vessels at the same time so that you will not bleed after the procedure.  Usually, stitches will not be used to close the cut. A white scab (eschar) will form in the area where your tonsils used to be. AFTER THE PROCEDURE After surgery, you will be taken to a recovery area for close monitoring. Once you are awake, stable, and taking fluids well, you will be allowed to go home.    This information is not intended to replace advice given to you by your health care provider. Make sure you discuss any questions you have with your health care provider.   Document Released: 06/15/2000 Document Revised: 07/18/2014 Document Reviewed: 09/28/2012 Elsevier Interactive Patient Education [employer].

## 2015-10-10 NOTE — Op Note (Signed)
10/10/2015  10:54 AM    lymph node,function  [id number]   Pre-Op Dx:  Deviated Nasal Septum, Hypertrophic Inferior Turbinates, Snoring  Post-op Dx: Same  Proc: Nasal Septoplasty, Bilateral SMR Inferior Turbinates, LAUP   Surg:  [name] T MD  Anes:  GOT  EBL:  20 ml  Comp:  none  Findings:  RIGHT septal deviation.  Large inferior turbinates bilat.  Long thick uvula and soft palate. 1+ embedded tonsils.  Procedure: With the patient in a comfortable supine position,  general orotracheal anesthesia was induced without difficulty.     The patient received preoperative Afrin spray for topical decongestion and vasoconstriction.  Intravenous prophylactic antibiotics were administered.  At an appropriate level, the patient was placed in a semi-sitting position.  A saline moistened throat pack was placed.  Nasal vibrissae were trimmed.  Afrin solution was applied on 0.5" x 3" cottonoids to both sides of the septal mucosa.   1% Xylocaine with 1:100,000 epinephrine, 10 cc's, was infiltrated into the anterior floor of the nose, into the nasal spine region, into the membranous columella, and finally into the submucoperichondrial plane of the septum on both sides.  Several minutes were allowed for this to take effect.  A sterile preparation and draping of the midface was accomplished in the standard fashion.  The materials were removed from the nose and observed to be intact and correct in number.  The nose was inspected with a headlight with the findings as described above.  A LEFT hemitransfixion incision was sharply executed and carried down to the caudal edge of the quadrangular cartilage and continued to a floor incision.  An opposite small floor incision was sharply executed as well.   Floor tunnels were elevated on both sides, carried posteriorly, then medially, then brought forward along the vomer and maxillary crest.  The submucoperichondrial plane of the  LEFT septum was  dissected up to the dorsum of the nose, back onto the perpendicular plate, and brought down and communicated with the floor tunnel and then forward along the maxillary crest.  The flap was generated intact.  The chondroethmoid junction was identified and opened with a Psychologist, educational.  The opposite submucoperiosteal plane of the perpendicular plate of the ethmoid  was elevated and carried down to the floor tunnel posteriorly.  The superior perpendicular plate was lysed with an open Jansen-Middleton forceps.  The inferior portion was dissected from the maxillary crest and vomer with a Cottle elevator.  The midportion was rocked free with a closed KeySpan forceps and then delivered.    The posterior inferior corner of the quadrangular cartilage was submucosally resected, including a cartilaginous tail up along the vomer.     The inferior edge of the caudal strut was shortened appprox 2 mm to allow it to trap door into the midline.  The septum was separated from the upper lateral cartilages sharply.    After mobilizing the septum adequately, and straightening it in the standard fashion,  The septum was secured to the nasal spine with a figure-of-eight 4-0 PDS suture.  A good straight midline configuration of the septum with good dorsal support was generated.  The septal tunnel was suctioned clear.  Hemostasis was observed.  The flaps were laid back down.  The incisions were closed with interrupted 4-0 chromic suture.    Just prior to completing the septoplasty, the inferior turbinates were each infiltrated with additional 1% Xylocaine with 1:100,000 epinephrine,  6 cc's total.  Upon completing the septoplasty, beginning  on the RIGHT side, the inferior turbinate was inspected and infractured.  The anterior hood of the inferior turbinate was sharply lysed just behind the nasal valve.  The medial mucosa of the inferior turbinate was incised in an  anterior upsloping fashion and a laterally based  flap was developed from the turbinate bone.  Using angled turbinate scissors, turbinate bone and lateral mucosa were resected in a posterior downsloping fashion, taking much of the anterior pole and leaving most of the posterior pole.  Bony spicules were submucosally dissected and removed.  The mucosal flap was laid back down and the turbinate was outfractured.  This completed one SMR inferior turbinate.  The opposite side was performed in identical fashion.  The cut mucosal edges were suction coagulated on both sides for hemostasis.  Again hemostasis was observed.  After completing both turbinate resections, 0.040" reinforced Silastic splints were fashioned, placed against the nasal septum for support, and secured thereto with a 3-0 Ethilon stitch.   Telfa packs impregnated with bacitracin ointment were placed between the septum and the inferior turbinates, one on each side. A 6.5 mm nasal trumpet was shortened and placed into each side of the nose for hemostasis, partial airway,  and support.    At this point, the patient was placed supine, the table was rotated 90 away from anesthesia, and then the patient was placed in Trendelenburg. The drapes were adjusted. Taking care to protect lips, teeth, and endotracheal tube, the Crowe Davis mouth gag was introduced, extended for visualization, and suspended from the [place] stand in the standard fashion. The findings were as described above, including the palatal dimple point tattoo placed in the office. Saline saturated eye pads were placed on the patient in saline saturated towels were used to drape out the field. The endotracheal tube was protected under the mouth gag. The laser had been previously tested for aim and  function. All personnel had protective eye gear.  The uvula was retracted downward. A chevron was made  below the tattoo mark through the mucosa. The dissection was carried down through the uvular muscle. A mucosal flap was left on the posterior  surface. This was brought forward and secured to the chevron with interrupted 4-0 Vicryl sutures.  Leaving a 5 mm gap of intact mucosa, vertical incisions were made at the superior aspect of the tonsil fossa on both sides angled inward through the soft tissue of the palate. This dissection was carried up almost to the level of the tattoo but quite lateral. Hemostasis was achieved with electrocautery. A small portion of the superior anterior tonsil pillar was removed with the laser.  The soft palate was then reapproximated laterally with a 4-0 Vicryl stitch. Hemostasis was observed. Mirror examination revealed a good oropharyngeal introitus. The pharynx was irrigated and suctioned clean. The mouth gag was relaxed for several minutes.    At this point the procedure was completed.  The pharynx was suctioned free and the throat pack was removed.   The patient was returned to anesthesia, awakened, extubated, and transferred to recovery in stable condition.  Dispo:   PACU to 3300 step down.  Plan: Ice, elevation, narcotic analgesia, prophylactic antibiotics for the duration of indwelling nasal foreign bodies.  We will remove the nasal packing In one day, the septal splints in 10 days.  Return to work in 10 days, strenuous activities in two weeks.  Tyson Alias MD

## 2015-10-11 ENCOUNTER — Encounter (HOSPITAL_COMMUNITY): Payer: Self-pay | Admitting: Internal Medicine

## 2015-10-11 DIAGNOSIS — Z6836 Body mass index (BMI) 36.0-36.9, adult: Secondary | ICD-10-CM | POA: Diagnosis not present

## 2015-10-11 DIAGNOSIS — G4733 Obstructive sleep apnea (adult) (pediatric): Secondary | ICD-10-CM | POA: Diagnosis not present

## 2015-10-11 DIAGNOSIS — J343 Hypertrophy of nasal turbinates: Secondary | ICD-10-CM | POA: Diagnosis not present

## 2015-10-11 DIAGNOSIS — J342 Deviated nasal septum: Secondary | ICD-10-CM | POA: Diagnosis not present

## 2015-10-11 DIAGNOSIS — F17210 Nicotine dependence, cigarettes, uncomplicated: Secondary | ICD-10-CM | POA: Diagnosis not present

## 2015-10-11 NOTE — Progress Notes (Signed)
Pt given discharge packet. Medication regimen and follow up appointments reviewed with patient. Pt states no further questions at this time. IV removed, and nasal gtt gauze replaced for patient.

## 2015-10-11 NOTE — Discharge Summary (Signed)
  10/11/2015 10:20 AM  [name], [name] [id number]  Post-Op Day 1    Temp:  [97.8 F (36.6 C)-99.6 F (37.6 C)] 97.8 F (36.6 C) (07/27 0839) Pulse Rate:  [61-103] 74 (07/27 0839) Resp:  [13-22] 17 (07/27 0839) BP: (133-152)/(73-90) 142/79 (07/27 0839) SpO2:  [91 %-95 %] 95 % (07/27 0839) Weight:  [119.3 kg (263 lb)] 119.3 kg (263 lb) (07/26 1205),     Intake/Output Summary (Last 24 hours) at 10/11/15 1020 Last data filed at 10/11/15 0900  Gross per 24 hour  Intake          3402.08 ml  Output             1150 ml  Net          2252.08 ml    No results found for this or any previous visit (from the past 24 hour(s)).  SUBJECTIVE:  Pain controlled. Drinking well.  Voiding spont.  Breathing easily  OBJECTIVE:  Color, energy OK.  Voice cl.  Packs removed without difficulty.  Pharynx clear.  IMPRESSION:  Satisfactory check  PLAN:  Discharge home  Admit: 26 JUL D/C:  27 JUL Final diagnosis:  Nasal septal deviation, hypertrophic inferior turbinates, snoring, obstructive sleep apnea  Proc:  Septoplasty, SMR inferior turbinates, LAUP. 26 JUL Comp:  None Cond:  Good.  Ambulatory. Voiding. Breathing well. Packs out.  No bleeding Recheck: 8 days my office Rx's: Oxycodone, Cephalexin Instructions written and given  Hosp Course:  Pt underwent successful surgery.  Admitted to overnight observation given known OSA.  Voiding well.  Drinking well.  Pain controlled.  O2 sats good.  Packs out AM POD 1. Discharged to home and care of family.  [name]

## 2015-10-11 NOTE — Care Management Note (Signed)
Case Management Note  Patient Details  Name: Richard [name] MRN: [id number] Date of Birth: [birth year]  Subjective/Objective:  Patient from home alone, pta indep, s/p nasal septal plasty, trumphet d'c'd today, he is for dc.  He has transportation home.  No further needs.                   Action/Plan:   Expected Discharge Date:  10/11/15               Expected Discharge Plan:  Home/Self Care  In-House Referral:     Discharge planning Services     Post Acute Care Choice:    Choice offered to:     DME Arranged:    DME Agency:     HH Arranged:    HH Agency:     Status of Service:  Completed, signed off  If discussed at [employer] of Stay Meetings, dates discussed:    Additional Comments:  Zenon Mayo, RN 10/11/2015, 11:25 AM

## 2015-10-16 DIAGNOSIS — F320 Major depressive disorder, single episode, mild: Secondary | ICD-10-CM | POA: Diagnosis not present

## 2015-10-18 ENCOUNTER — Encounter: Payer: Self-pay | Admitting: Podiatry

## 2015-10-18 ENCOUNTER — Ambulatory Visit (INDEPENDENT_AMBULATORY_CARE_PROVIDER_SITE_OTHER): Payer: BLUE CROSS/BLUE SHIELD | Admitting: Podiatry

## 2015-10-18 DIAGNOSIS — Z79899 Other long term (current) drug therapy: Secondary | ICD-10-CM

## 2015-10-18 DIAGNOSIS — B351 Tinea unguium: Secondary | ICD-10-CM | POA: Diagnosis not present

## 2015-10-18 NOTE — Progress Notes (Signed)
Subjective: 45 year-old male presents the office they for follow-up evaluation 6 weeks after starting Lamisil. He has had no side effects the medicine. He has noticed some clear on the toenails. Denies any redness or drainage the toenails or any swelling. Denies any systemic complaints such as fevers, chills, nausea, vomiting. No acute changes since last appointment, and no other complaints at this time.   Objective: AAO x3, NAD DP/PT pulses palpable bilaterally, CRT less than 3 seconds Nail somewhat discolored, dystrophic. There is clearing along the proximal nail borders.No tenderness to palpation or no swelling redness or drainage or signs of infection. No edema, erythema, increase in warmth to bilateral lower extremities.  No open lesions or pre-ulcerative lesions.  No pain with calf compression, swelling, warmth, erythema  Assessment: Onychomycosis, currently on Lamisil  Plan: -All treatment options discussed with the patient including all alternatives, risks, complications.  -Reviewed her blood work today weekly CBC and LFTs. Continue Lamisil and continue monitoring side effects the medicine. -Follow-up once he completes his course of Lamisil by mouth has not noticed any change. Meantime call any questions or concerns -Patient encouraged to call the office with any questions, concerns, change in symptoms.   Celesta Gentile, DPM

## 2015-10-19 DIAGNOSIS — Z79899 Other long term (current) drug therapy: Secondary | ICD-10-CM | POA: Diagnosis not present

## 2015-10-20 LAB — HEPATIC FUNCTION PANEL
ALBUMIN: 4.7 g/dL (ref 3.5–5.5)
ALK PHOS: 68 IU/L (ref 39–117)
ALT: 28 IU/L (ref 0–44)
AST: 19 IU/L (ref 0–40)
BILIRUBIN, DIRECT: 0.25 mg/dL (ref 0.00–0.40)
Bilirubin Total: 0.9 mg/dL (ref 0.0–1.2)
TOTAL PROTEIN: 6.5 g/dL (ref 6.0–8.5)

## 2015-10-20 LAB — CBC WITH DIFFERENTIAL/PLATELET
BASOS: 0 %
Basophils Absolute: 0 10*3/uL (ref 0.0–0.2)
EOS (ABSOLUTE): 0.2 10*3/uL (ref 0.0–0.4)
EOS: 3 %
HEMATOCRIT: 43.2 % (ref 37.5–51.0)
HEMOGLOBIN: 14.9 g/dL (ref 12.6–17.7)
IMMATURE GRANULOCYTES: 0 %
Immature Grans (Abs): 0 10*3/uL (ref 0.0–0.1)
Lymphocytes Absolute: 2 10*3/uL (ref 0.7–3.1)
Lymphs: 23 %
MCH: 29.8 pg (ref 26.6–33.0)
MCHC: 34.5 g/dL (ref 31.5–35.7)
MCV: 86 fL (ref 79–97)
MONOCYTES: 7 %
MONOS ABS: 0.6 10*3/uL (ref 0.1–0.9)
NEUTROS PCT: 67 %
Neutrophils Absolute: 5.6 10*3/uL (ref 1.4–7.0)
Platelets: 288 10*3/uL (ref 150–379)
RBC: 5 x10E6/uL (ref 4.14–5.80)
RDW: 13.2 % (ref 12.3–15.4)
WBC: 8.5 10*3/uL (ref 3.4–10.8)

## 2015-10-22 ENCOUNTER — Telehealth: Payer: Self-pay | Admitting: *Deleted

## 2015-10-22 NOTE — Telephone Encounter (Addendum)
-----   Message from Trula Slade, DPM sent at 10/22/2015  7:57 AM EDT ----- Please let him know blood work normal. Elizebeth Koller lamsil. Left message informing pt of Dr. Leigh Aurora orders.

## 2015-10-24 DIAGNOSIS — F320 Major depressive disorder, single episode, mild: Secondary | ICD-10-CM | POA: Diagnosis not present

## 2015-10-30 DIAGNOSIS — F320 Major depressive disorder, single episode, mild: Secondary | ICD-10-CM | POA: Diagnosis not present

## 2015-11-05 ENCOUNTER — Encounter: Payer: Self-pay | Admitting: Emergency Medicine

## 2015-11-05 ENCOUNTER — Emergency Department
Admission: EM | Admit: 2015-11-05 | Discharge: 2015-11-05 | Disposition: A | Discharge status: Home / Self Care | Payer: BLUE CROSS/BLUE SHIELD | Attending: Emergency Medicine | Admitting: Emergency Medicine

## 2015-11-05 DIAGNOSIS — R040 Epistaxis: Secondary | ICD-10-CM

## 2015-11-05 MED ORDER — SILVER NITRATE-POT NITRATE 75-25 % EX MISC
CUTANEOUS | Status: AC
  Filled 2015-11-05: qty 1

## 2015-11-05 MED ORDER — TRANEXAMIC ACID 1000 MG/10ML IV SOLN
500.0000 mg | Freq: Once | INTRAVENOUS | Status: DC
  Filled 2015-11-05: qty 10

## 2015-11-05 MED ORDER — PHENYLEPHRINE HCL 0.5 % NA SOLN
1.0000 [drp] | Freq: Once | NASAL | Status: AC
  Administered 2015-11-05: 1 [drp] via NASAL
  Filled 2015-11-05: qty 15

## 2015-11-05 NOTE — ED Triage Notes (Signed)
Nose bleed.  INitially started around 0830 this morning and patient was able to stop the bleeding, then patient sneezed around 1130 and nose bleed has returned.  Patient states he had some nasal surgery about 2 weeks ago-- septal straitening, nasal widening and palate surgery.

## 2015-11-05 NOTE — ED Provider Notes (Signed)
[facility] Emergency Department Provider Note  ____________________________________________  Time seen: Approximately 1:47 PM  I have reviewed the triage vital signs and the nursing notes.   HISTORY  Chief Complaint Epistaxis   HPI Richard [name] is a 45 y.o. male a history of rhinoplasty on 10/10/15 who presents for evaluation of epistaxis. Patient reports that started this morning after he sneezed. He said that initially resolved but started again about an hour ago more profusely. Patient denies chest pain, shortness of breath, syncope. He does not take any blood thinners. No trauma. He has tried applying pressure at home with no success,.  Past Medical History:  Diagnosis Date  . Medical history non-contributory     Patient Active Problem List   Diagnosis Date Noted  . Onychomycosis 10/18/2015  . Sleep apnea 10/10/2015  . Tinea pedis of both feet 07/29/2015  . Xerosis of skin 07/29/2015  . Toenail deformity 06/26/2015  . Lipoma 06/26/2015  . Chronic nasal congestion 06/26/2015  . Hyperlipidemia     Past Surgical History:  Procedure Laterality Date  . 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 CHOLECYSTECTOMY    . COSMETIC SURGERY Left    lipoma removal, arm/ forehead  . NASAL SEPTOPLASTY W/ TURBINOPLASTY Bilateral 10/10/2015   Procedure: NASAL SEPTOPLASTY WITH BILATERAL TURBINATE REDUCTION;  Surgeon: [name], MD;  Location: [place];  Service: ENT;  Laterality: Bilateral;  . UVULECTOMY N/A 10/10/2015   Procedure: Myrtis Ser ASSISTED;  Surgeon: [name], MD;  Location: [place];  Service: ENT;  Laterality: N/A;    Prior to Admission medications   Medication Sig Start Date End Date Taking? Authorizing Provider  ketoconazole (NIZORAL) 2 % cream Apply 1 application topically daily. 07/26/15   Trula Slade, DPM  terbinafine (LAMISIL) 250 MG tablet Take 1 tablet (250 mg total) by mouth daily. 09/04/15   Trula Slade, DPM  urea (CARMOL) 40  % CREA Apply 1 application topically daily. 07/26/15   Trula Slade, DPM    Allergies Ppd [tuberculin purified protein derivative]  Family History  Problem Relation Age of Onset  . Diabetes Mother   . Cancer Mother 73    breast, skin,lymphoma, deceased  . Cancer Father     prostate, skin  . Depression Sister   . Cancer Sister     sarcoidosis  . Depression Brother     Social History Social History  Substance Use Topics  . Smoking status: Never Smoker  . Smokeless tobacco: Not on file  . Alcohol use No    Review of Systems Constitutional: Negative for fever. Eyes: Negative for visual changes. ENT: Negative for sore throat. + epistaxis Cardiovascular: Negative for chest pain. Respiratory: Negative for shortness of breath. Gastrointestinal: Negative for abdominal pain, vomiting or diarrhea. Genitourinary: Negative for dysuria. Musculoskeletal: Negative for back pain. Skin: Negative for rash. Neurological: Negative for headaches, weakness or numbness.  ____________________________________________   PHYSICAL EXAM:  VITAL SIGNS: ED Triage Vitals  Enc Vitals Group     BP 11/05/15 1212 132/76     Pulse Rate 11/05/15 1212 84     Resp 11/05/15 1212 18     Temp 11/05/15 1212 98 F (36.7 C)     Temp Source 11/05/15 1212 Oral     SpO2 11/05/15 1212 99 %     Weight 11/05/15 1214 240 lb (108.9 kg)     Height 11/05/15 1214 5\' 11"  (1.803 m)     Head Circumference --  Peak Flow --      Pain Score 11/05/15 1214 0     Pain Loc --      Pain Edu? --      Excl. in [place]? --     Constitutional: Alert and oriented. Well appearing and in no apparent distress. HEENT:      Head: Normocephalic and atraumatic.         Eyes: Conjunctivae are normal. Sclera is non-icteric. EOMI. PERRL      Nose: epistaxis from left nare. Multiple clots evacuated.       Mouth/Throat: Mucous membranes are moist.       Neck: Supple with no signs of meningismus. Cardiovascular: Regular rate and  rhythm. No murmurs, gallops, or rubs. 2+ symmetrical distal pulses are present in all extremities. No JVD. Respiratory: Normal respiratory effort. Lungs are clear to auscultation bilaterally. No wheezes, crackles, or rhonchi.  Musculoskeletal: Nontender with normal range of motion in all extremities. No edema, cyanosis, or erythema of extremities. Neurologic: Normal speech and language. Face is symmetric. Moving all extremities. No gross focal neurologic deficits are appreciated. Skin: Skin is warm, dry and intact. No rash noted. Psychiatric: Mood and affect are normal. Speech and behavior are normal.  ____________________________________________   LABS (all labs ordered are listed, but only abnormal results are displayed)  Labs Reviewed - No data to display ____________________________________________  EKG  None  ____________________________________________  RADIOLOGY  none  ____________________________________________   PROCEDURES  Procedure(s) performed:yes Procedures   Epistaxis Management Date/Time: 11/05/2015 at 3:55 PM Performed by: Rudene Re Authorized by: Rudene Re Consent: Verbal consent obtained. Written consent not obtained. Risks and benefits: risks, benefits and alternatives were discussed Consent given by: patient Required items: required blood products, implants, devices, and special equipment available Patient sedated: no Repair method: clots evacuated, afrin applied, pressure applied for 30 min Post-procedure assessment: bleeding stopped after 30 minutes  Treatment complexity: simple Patient tolerance: Patient tolerated the procedure well with no immediate complications   Critical Care performed:  None ____________________________________________   INITIAL IMPRESSION / ASSESSMENT AND PLAN / ED COURSE   45 y.o. male a history of rhinoplasty on 10/10/15 who presents for evaluation of epistaxis. Clots evacuated, afrin and pressure  applied for 77min with resolution of epistaxis. Evaluation of the mucosa after bleeding stopped and I was not able to visualize the source of the bleeding. No cautery used. Patient will be observed for 19min. Remains HD stable  Clinical Course  Comment By Time  Patient observed for 45 min after pressure was removed from nose with no further bleeding. Vitals stable. Will dc home with return precautions. Rudene Re, MD 08/21 1552    Pertinent labs & imaging results that were available during my care of the patient were reviewed by me and considered in my medical decision making (see chart for details).    ____________________________________________   FINAL CLINICAL IMPRESSION(S) / ED DIAGNOSES  Final diagnoses:  Epistaxis      NEW MEDICATIONS STARTED DURING THIS VISIT:  New Prescriptions   No medications on file     Note:  This document was prepared using Dragon voice recognition software and may include unintentional dictation errors.    Rudene Re, MD 11/05/15 1630

## 2015-11-05 NOTE — ED Triage Notes (Signed)
Patient presents to the ED with nosebleed.  Patient reports having a nasal surgery about 1 month ago with no complications and then having a nosebleed this morning that resolved and then began again.  Patient states his nose has been bleeding x 1 hour.  This RN had patient place a nose clamp on his nose and gave patient gauze.  Patient is alert and oriented x 4.  Nose is bleeding at this time.

## 2015-11-06 DIAGNOSIS — R040 Epistaxis: Secondary | ICD-10-CM | POA: Diagnosis not present

## 2015-11-14 DIAGNOSIS — F320 Major depressive disorder, single episode, mild: Secondary | ICD-10-CM | POA: Diagnosis not present

## 2015-11-21 ENCOUNTER — Other Ambulatory Visit: Payer: Self-pay | Admitting: Internal Medicine

## 2015-11-21 DIAGNOSIS — Z0000 Encounter for general adult medical examination without abnormal findings: Secondary | ICD-10-CM

## 2015-11-27 ENCOUNTER — Other Ambulatory Visit: Payer: BLUE CROSS/BLUE SHIELD

## 2015-11-28 DIAGNOSIS — F320 Major depressive disorder, single episode, mild: Secondary | ICD-10-CM | POA: Diagnosis not present

## 2015-12-05 DIAGNOSIS — F320 Major depressive disorder, single episode, mild: Secondary | ICD-10-CM | POA: Diagnosis not present

## 2015-12-06 ENCOUNTER — Ambulatory Visit (INDEPENDENT_AMBULATORY_CARE_PROVIDER_SITE_OTHER): Payer: BLUE CROSS/BLUE SHIELD | Admitting: Podiatry

## 2015-12-06 ENCOUNTER — Encounter: Payer: BLUE CROSS/BLUE SHIELD | Admitting: Primary Care

## 2015-12-06 ENCOUNTER — Encounter: Payer: Self-pay | Admitting: Podiatry

## 2015-12-06 DIAGNOSIS — Z79899 Other long term (current) drug therapy: Secondary | ICD-10-CM | POA: Diagnosis not present

## 2015-12-06 DIAGNOSIS — B351 Tinea unguium: Secondary | ICD-10-CM | POA: Diagnosis not present

## 2015-12-06 MED ORDER — TERBINAFINE HCL 250 MG PO TABS: 250.0000 mg | ORAL_TABLET | Freq: Every day | ORAL | 0 refills | Status: DC

## 2015-12-06 NOTE — Progress Notes (Signed)
Subjective:  45 year old male presents For Follow-Up Evaluation of Nail Fungus to Both of His Big Toes. He Is Almost Completely Lamisil Has Not Been Consistent As He Had Surgery since I Last Saw Him He Was Not Able to Swallow Pills. He does have a Tinel's or looking much better. Denies any drainage or redness or any swelling. Denies any systemic complaints such as fevers, chills, nausea, vomiting. No acute changes since last appointment, and no other complaints at this time.   Objective: AAO x3, NAD DP/PT pulses palpable bilaterally, CRT less than 3 seconds Bilateral hallux nails proximal clear to the nail borders as normal discoloration dystrophy to the distal portion. There does appear to be greatly improved. No tenderness and no surrounding redness or drainage or signs of infection.  No open lesions or pre-ulcerative lesions.  No pain with calf compression, swelling, warmth, erythema  Assessment: Resolving onychomycosis, currently on Lamisil  Plan: -All treatment options discussed with the patient including all alternatives, risks, complications.  -He is tolerating medication well any complications. Continue with medication. We will continue Lamisil for total of 4 months. Refilled today as well as repeat blood work. Continue to monitor for side effects. Once this is complete continue to monitor the next couple months still of the nails grow out. Call the office with any questions or concerns or further not improving. -Patient encouraged to call the office with any questions, concerns, change in symptoms.   Celesta Gentile, DPM

## 2015-12-06 NOTE — Patient Instructions (Signed)

## 2015-12-07 ENCOUNTER — Other Ambulatory Visit (INDEPENDENT_AMBULATORY_CARE_PROVIDER_SITE_OTHER): Payer: BLUE CROSS/BLUE SHIELD

## 2015-12-07 DIAGNOSIS — Z0000 Encounter for general adult medical examination without abnormal findings: Secondary | ICD-10-CM | POA: Diagnosis not present

## 2015-12-07 LAB — CBC
HEMATOCRIT: 40.4 % (ref 39.0–52.0)
Hemoglobin: 13.7 g/dL (ref 13.0–17.0)
MCHC: 34 g/dL (ref 30.0–36.0)
MCV: 88 fl (ref 78.0–100.0)
Platelets: 222 10*3/uL (ref 150.0–400.0)
RBC: 4.59 Mil/uL (ref 4.22–5.81)
RDW: 14.2 % (ref 11.5–15.5)
WBC: 7.6 10*3/uL (ref 4.0–10.5)

## 2015-12-07 LAB — COMPREHENSIVE METABOLIC PANEL
ALK PHOS: 50 U/L (ref 39–117)
ALT: 27 U/L (ref 0–53)
AST: 16 U/L (ref 0–37)
Albumin: 4.2 g/dL (ref 3.5–5.2)
BUN: 13 mg/dL (ref 6–23)
CHLORIDE: 105 meq/L (ref 96–112)
CO2: 31 mEq/L (ref 19–32)
Calcium: 9.2 mg/dL (ref 8.4–10.5)
Creatinine, Ser: 1.08 mg/dL (ref 0.40–1.50)
GFR: 78 mL/min (ref >60.00)
GLUCOSE: 94 mg/dL (ref 70–99)
POTASSIUM: 4.2 meq/L (ref 3.5–5.1)
SODIUM: 142 meq/L (ref 135–145)
TOTAL PROTEIN: 6.5 g/dL (ref 6.0–8.3)
Total Bilirubin: 0.5 mg/dL (ref 0.2–1.2)

## 2015-12-07 LAB — LIPID PANEL
Cholesterol: 170 mg/dL (ref 0–200)
HDL: 42.4 mg/dL (ref >39.00)
LDL CALC: 96 mg/dL (ref 0–99)
NONHDL: 127.21
Total CHOL/HDL Ratio: 4
Triglycerides: 155 mg/dL — ABNORMAL HIGH (ref 0.0–149.0)
VLDL: 31 mg/dL (ref 0.0–40.0)

## 2015-12-11 ENCOUNTER — Encounter: Payer: Self-pay | Admitting: Primary Care

## 2015-12-11 ENCOUNTER — Ambulatory Visit (INDEPENDENT_AMBULATORY_CARE_PROVIDER_SITE_OTHER): Payer: BLUE CROSS/BLUE SHIELD | Admitting: Primary Care

## 2015-12-11 DIAGNOSIS — D179 Benign lipomatous neoplasm, unspecified: Secondary | ICD-10-CM | POA: Diagnosis not present

## 2015-12-11 DIAGNOSIS — B351 Tinea unguium: Secondary | ICD-10-CM

## 2015-12-11 DIAGNOSIS — D485 Neoplasm of uncertain behavior of skin: Secondary | ICD-10-CM | POA: Diagnosis not present

## 2015-12-11 DIAGNOSIS — Z0000 Encounter for general adult medical examination without abnormal findings: Secondary | ICD-10-CM | POA: Diagnosis not present

## 2015-12-11 DIAGNOSIS — E785 Hyperlipidemia, unspecified: Secondary | ICD-10-CM

## 2015-12-11 DIAGNOSIS — L7211 Pilar cyst: Secondary | ICD-10-CM | POA: Diagnosis not present

## 2015-12-11 DIAGNOSIS — R0981 Nasal congestion: Secondary | ICD-10-CM | POA: Diagnosis not present

## 2015-12-11 NOTE — Assessment & Plan Note (Signed)
Following with podiatry. Managed on Lamisil.

## 2015-12-11 NOTE — Progress Notes (Signed)
Subjective:    Patient ID: Richard [name], male    DOB: 1970-07-07, 45 y.o.   MRN: [id number]  HPI  Richard [name] is a 45 year old male who presents today for complete physical.  Immunizations: -Tetanus: Completed Tdap in 2015 -Influenza: Due today.   Diet: He endorses a fair diet. Breakfast: Fruit, egg, potatoes Lunch: Salad with protein, fruit Dinner: Salad, potato, fruit Snacks: Nuts, granola bars Desserts: Occasionally 1-2 night weekly Beverages: Coffee (2-3 cups), water, soda   Wt Readings from Last 3 Encounters:  12/11/15 261 lb 12.8 oz (118.8 kg)  11/05/15 240 lb (108.9 kg)  10/10/15 263 lb (119.3 kg)     Exercise:  He is walking/jogging several days weekly. Eye exam: Completed several years ago. Due. Dental exam: Completes regularly.    Review of Systems  Constitutional: Negative for unexpected weight change.  HENT: Negative for rhinorrhea.   Respiratory: Negative for cough and shortness of breath.   Cardiovascular: Negative for chest pain.  Gastrointestinal: Negative for constipation and diarrhea.  Genitourinary: Negative for difficulty urinating.  Musculoskeletal: Negative for arthralgias and myalgias.       Right heel pain since running. Will experience pain to posterior heel with walking, overall improvement.   Skin: Negative for rash.  Allergic/Immunologic: Negative for environmental allergies.  Neurological: Negative for dizziness, numbness and headaches.  Psychiatric/Behavioral:       Denies concerns for anxiety or depression       Past Medical History:  Diagnosis Date  . Medical history non-contributory      Social History   Social History  . Marital status: Divorced    Spouse name: N/A  . Number of children: N/A  . Years of education: N/A   Occupational History  . Not on file.   Social History Main Topics  . Smoking status: Never Smoker  . Smokeless tobacco: Not on file  . Alcohol use No  . Drug use: No  . Sexual activity: Yes   Partners: Female   Other Topics Concern  . Not on file   Social History Narrative  . No narrative on file    Past Surgical History:  Procedure Laterality Date  . 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 CHOLECYSTECTOMY    . COSMETIC SURGERY Left    lipoma removal, arm/ forehead  . NASAL SEPTOPLASTY W/ TURBINOPLASTY Bilateral 10/10/2015   Procedure: NASAL SEPTOPLASTY WITH BILATERAL TURBINATE REDUCTION;  Surgeon: [name], MD;  Location: [place];  Service: ENT;  Laterality: Bilateral;  . UVULECTOMY N/A 10/10/2015   Procedure: Myrtis Ser ASSISTED;  Surgeon: [name], MD;  Location: [facility] OR;  Service: ENT;  Laterality: N/A;    Family History  Problem Relation Age of Onset  . Diabetes Mother   . Cancer Mother 57    breast, skin,lymphoma, deceased  . Cancer Father     prostate, skin  . Depression Sister   . Cancer Sister     sarcoidosis  . Depression Brother     Allergies  Allergen Reactions  . Ppd [Tuberculin Purified Protein Derivative] Other (See Comments)    + PPD NEG CXR 2/09    Current Outpatient Prescriptions on File Prior to Visit  Medication Sig Dispense Refill  . terbinafine (LAMISIL) 250 MG tablet Take 1 tablet (250 mg total) by mouth daily. 30 tablet 0   No current facility-administered medications on file prior to visit.     BP 118/72   Pulse 75   Temp  97.8 F (36.6 C) (Oral)   Ht 5\' 11"  (1.803 m)   Wt 261 lb 12.8 oz (118.8 kg)   SpO2 98%   BMI 36.51 kg/m    Objective:   Physical Exam  Constitutional: He is oriented to person, place, and time. He appears well-nourished.  HENT:  Right Ear: Tympanic membrane and ear canal normal.  Left Ear: Tympanic membrane and ear canal normal.  Nose: Nose normal. Right sinus exhibits no maxillary sinus tenderness and no frontal sinus tenderness. Left sinus exhibits no maxillary sinus tenderness and no frontal sinus tenderness.  Mouth/Throat: Oropharynx is clear and moist.  Eyes: Conjunctivae and EOM are  normal. Pupils are equal, round, and reactive to light.  Neck: Neck supple. Carotid bruit is not present. No thyromegaly present.  Cardiovascular: Normal rate, regular rhythm and normal heart sounds.   Pulmonary/Chest: Effort normal and breath sounds normal. He has no wheezes. He has no rales.  Abdominal: Soft. Bowel sounds are normal. There is no tenderness.  Musculoskeletal: Normal range of motion.  Neurological: He is alert and oriented to person, place, and time. He has normal reflexes. No cranial nerve deficit.  Skin: Skin is warm and dry.  Psychiatric: He has a normal mood and affect.          Assessment & Plan:

## 2015-12-11 NOTE — Assessment & Plan Note (Signed)
Managed per ENT in [place].

## 2015-12-11 NOTE — Progress Notes (Signed)
Pre visit review using our clinic review tool, if applicable. No additional management support is needed unless otherwise documented below in the visit note. 

## 2015-12-11 NOTE — Patient Instructions (Signed)
Your triglycerides are slightly too high. Continue your efforts towards a healthy lifestyle and these levels will decrease.  Continue exercising. You should be getting 150 minutes of moderate intensity exercise weekly.  Ensure you are consuming 64 ounces of water daily.  Follow up in 1 year for repeat physical or sooner if needed.  It was a pleasure to see you today!

## 2015-12-11 NOTE — Assessment & Plan Note (Signed)
Surgical removal of cyst 1 week ago. Surgical site appears to be healing well.

## 2015-12-11 NOTE — Assessment & Plan Note (Signed)
Overall improvement when compared to labs in 2015. Slight elevation in Trigs which I suspect will improve as he continues to lose weight through healthy lifestyle changes. Will continue to monitor.

## 2015-12-11 NOTE — Assessment & Plan Note (Signed)
Immunizations UTD. Influenza provided today. Commended him on his weight loss efforts and encouraged him to continue with regular exercise. Exam unremarkable. Labs stable. Follow up in 1 year for repeat physical.

## 2015-12-12 DIAGNOSIS — F320 Major depressive disorder, single episode, mild: Secondary | ICD-10-CM | POA: Diagnosis not present

## 2015-12-19 DIAGNOSIS — F320 Major depressive disorder, single episode, mild: Secondary | ICD-10-CM | POA: Diagnosis not present

## 2015-12-26 DIAGNOSIS — F320 Major depressive disorder, single episode, mild: Secondary | ICD-10-CM | POA: Diagnosis not present

## 2016-01-02 DIAGNOSIS — F320 Major depressive disorder, single episode, mild: Secondary | ICD-10-CM | POA: Diagnosis not present

## 2016-01-09 DIAGNOSIS — F320 Major depressive disorder, single episode, mild: Secondary | ICD-10-CM | POA: Diagnosis not present

## 2016-01-23 DIAGNOSIS — F320 Major depressive disorder, single episode, mild: Secondary | ICD-10-CM | POA: Diagnosis not present

## 2016-01-29 DIAGNOSIS — F320 Major depressive disorder, single episode, mild: Secondary | ICD-10-CM | POA: Diagnosis not present

## 2016-02-06 DIAGNOSIS — F320 Major depressive disorder, single episode, mild: Secondary | ICD-10-CM | POA: Diagnosis not present

## 2016-02-13 DIAGNOSIS — F320 Major depressive disorder, single episode, mild: Secondary | ICD-10-CM | POA: Diagnosis not present

## 2016-02-20 DIAGNOSIS — F320 Major depressive disorder, single episode, mild: Secondary | ICD-10-CM | POA: Diagnosis not present

## 2016-02-25 DIAGNOSIS — D229 Melanocytic nevi, unspecified: Secondary | ICD-10-CM | POA: Diagnosis not present

## 2016-02-25 DIAGNOSIS — D1800 Hemangioma unspecified site: Secondary | ICD-10-CM | POA: Diagnosis not present

## 2016-02-25 DIAGNOSIS — L720 Epidermal cyst: Secondary | ICD-10-CM | POA: Diagnosis not present

## 2016-02-25 DIAGNOSIS — Z1283 Encounter for screening for malignant neoplasm of skin: Secondary | ICD-10-CM | POA: Diagnosis not present

## 2016-02-27 DIAGNOSIS — F320 Major depressive disorder, single episode, mild: Secondary | ICD-10-CM | POA: Diagnosis not present

## 2016-03-05 DIAGNOSIS — F320 Major depressive disorder, single episode, mild: Secondary | ICD-10-CM | POA: Diagnosis not present

## 2016-03-12 DIAGNOSIS — F320 Major depressive disorder, single episode, mild: Secondary | ICD-10-CM | POA: Diagnosis not present

## 2016-03-19 DIAGNOSIS — F320 Major depressive disorder, single episode, mild: Secondary | ICD-10-CM | POA: Diagnosis not present

## 2016-03-26 DIAGNOSIS — F320 Major depressive disorder, single episode, mild: Secondary | ICD-10-CM | POA: Diagnosis not present

## 2016-04-09 DIAGNOSIS — F320 Major depressive disorder, single episode, mild: Secondary | ICD-10-CM | POA: Diagnosis not present

## 2016-05-28 DIAGNOSIS — F320 Major depressive disorder, single episode, mild: Secondary | ICD-10-CM | POA: Diagnosis not present

## 2016-06-04 DIAGNOSIS — F320 Major depressive disorder, single episode, mild: Secondary | ICD-10-CM | POA: Diagnosis not present

## 2016-06-11 DIAGNOSIS — F320 Major depressive disorder, single episode, mild: Secondary | ICD-10-CM | POA: Diagnosis not present

## 2016-06-18 DIAGNOSIS — F320 Major depressive disorder, single episode, mild: Secondary | ICD-10-CM | POA: Diagnosis not present

## 2016-07-09 DIAGNOSIS — F320 Major depressive disorder, single episode, mild: Secondary | ICD-10-CM | POA: Diagnosis not present

## 2016-07-23 DIAGNOSIS — F320 Major depressive disorder, single episode, mild: Secondary | ICD-10-CM | POA: Diagnosis not present

## 2016-07-30 DIAGNOSIS — F320 Major depressive disorder, single episode, mild: Secondary | ICD-10-CM | POA: Diagnosis not present

## 2016-08-13 DIAGNOSIS — F320 Major depressive disorder, single episode, mild: Secondary | ICD-10-CM | POA: Diagnosis not present

## 2016-08-20 DIAGNOSIS — F320 Major depressive disorder, single episode, mild: Secondary | ICD-10-CM | POA: Diagnosis not present

## 2016-08-28 DIAGNOSIS — R635 Abnormal weight gain: Secondary | ICD-10-CM | POA: Diagnosis not present

## 2016-08-28 DIAGNOSIS — E669 Obesity, unspecified: Secondary | ICD-10-CM | POA: Diagnosis not present

## 2016-08-28 DIAGNOSIS — Z6837 Body mass index (BMI) 37.0-37.9, adult: Secondary | ICD-10-CM | POA: Diagnosis not present

## 2016-09-03 DIAGNOSIS — F320 Major depressive disorder, single episode, mild: Secondary | ICD-10-CM | POA: Diagnosis not present

## 2016-09-04 DIAGNOSIS — E785 Hyperlipidemia, unspecified: Secondary | ICD-10-CM | POA: Diagnosis not present

## 2016-09-04 DIAGNOSIS — E8881 Metabolic syndrome: Secondary | ICD-10-CM | POA: Diagnosis not present

## 2016-09-04 DIAGNOSIS — Z6838 Body mass index (BMI) 38.0-38.9, adult: Secondary | ICD-10-CM | POA: Diagnosis not present

## 2016-09-04 DIAGNOSIS — E669 Obesity, unspecified: Secondary | ICD-10-CM | POA: Diagnosis not present

## 2016-09-10 DIAGNOSIS — F320 Major depressive disorder, single episode, mild: Secondary | ICD-10-CM | POA: Diagnosis not present

## 2016-09-22 DIAGNOSIS — L821 Other seborrheic keratosis: Secondary | ICD-10-CM | POA: Diagnosis not present

## 2016-09-22 DIAGNOSIS — L820 Inflamed seborrheic keratosis: Secondary | ICD-10-CM | POA: Diagnosis not present

## 2016-09-22 DIAGNOSIS — Z1283 Encounter for screening for malignant neoplasm of skin: Secondary | ICD-10-CM | POA: Diagnosis not present

## 2016-09-22 DIAGNOSIS — L508 Other urticaria: Secondary | ICD-10-CM | POA: Diagnosis not present

## 2016-09-22 DIAGNOSIS — L570 Actinic keratosis: Secondary | ICD-10-CM | POA: Diagnosis not present

## 2016-09-22 DIAGNOSIS — Z808 Family history of malignant neoplasm of other organs or systems: Secondary | ICD-10-CM | POA: Diagnosis not present

## 2016-09-24 DIAGNOSIS — F320 Major depressive disorder, single episode, mild: Secondary | ICD-10-CM | POA: Diagnosis not present

## 2016-10-01 DIAGNOSIS — F320 Major depressive disorder, single episode, mild: Secondary | ICD-10-CM | POA: Diagnosis not present

## 2016-10-07 DIAGNOSIS — F320 Major depressive disorder, single episode, mild: Secondary | ICD-10-CM | POA: Diagnosis not present

## 2016-10-15 DIAGNOSIS — F320 Major depressive disorder, single episode, mild: Secondary | ICD-10-CM | POA: Diagnosis not present

## 2016-10-22 DIAGNOSIS — F320 Major depressive disorder, single episode, mild: Secondary | ICD-10-CM | POA: Diagnosis not present

## 2016-10-29 DIAGNOSIS — F320 Major depressive disorder, single episode, mild: Secondary | ICD-10-CM | POA: Diagnosis not present

## 2016-11-04 DIAGNOSIS — F320 Major depressive disorder, single episode, mild: Secondary | ICD-10-CM | POA: Diagnosis not present

## 2016-11-12 DIAGNOSIS — F320 Major depressive disorder, single episode, mild: Secondary | ICD-10-CM | POA: Diagnosis not present

## 2016-11-19 DIAGNOSIS — F320 Major depressive disorder, single episode, mild: Secondary | ICD-10-CM | POA: Diagnosis not present

## 2016-11-26 DIAGNOSIS — F320 Major depressive disorder, single episode, mild: Secondary | ICD-10-CM | POA: Diagnosis not present

## 2016-12-01 ENCOUNTER — Other Ambulatory Visit: Payer: Self-pay | Admitting: Primary Care

## 2016-12-01 DIAGNOSIS — E785 Hyperlipidemia, unspecified: Secondary | ICD-10-CM

## 2016-12-10 DIAGNOSIS — F320 Major depressive disorder, single episode, mild: Secondary | ICD-10-CM | POA: Diagnosis not present

## 2016-12-11 ENCOUNTER — Other Ambulatory Visit (INDEPENDENT_AMBULATORY_CARE_PROVIDER_SITE_OTHER): Payer: BLUE CROSS/BLUE SHIELD

## 2016-12-11 DIAGNOSIS — E785 Hyperlipidemia, unspecified: Secondary | ICD-10-CM | POA: Diagnosis not present

## 2016-12-11 LAB — COMPREHENSIVE METABOLIC PANEL
ALT: 26 U/L (ref 0–53)
AST: 18 U/L (ref 0–37)
Albumin: 4.4 g/dL (ref 3.5–5.2)
Alkaline Phosphatase: 49 U/L (ref 39–117)
BUN: 15 mg/dL (ref 6–23)
CALCIUM: 9.3 mg/dL (ref 8.4–10.5)
CHLORIDE: 105 meq/L (ref 96–112)
CO2: 29 meq/L (ref 19–32)
CREATININE: 1.09 mg/dL (ref 0.40–1.50)
GFR: 76.84 mL/min (ref >60.00)
Glucose, Bld: 112 mg/dL — ABNORMAL HIGH (ref 70–99)
Potassium: 3.8 mEq/L (ref 3.5–5.1)
SODIUM: 139 meq/L (ref 135–145)
Total Bilirubin: 0.7 mg/dL (ref 0.2–1.2)
Total Protein: 6.2 g/dL (ref 6.0–8.3)

## 2016-12-11 LAB — LIPID PANEL
CHOL/HDL RATIO: 3
Cholesterol: 138 mg/dL (ref 0–200)
HDL: 41.3 mg/dL (ref >39.00)
LDL CALC: 79 mg/dL (ref 0–99)
NonHDL: 96.5
TRIGLYCERIDES: 86 mg/dL (ref 0.0–149.0)
VLDL: 17.2 mg/dL (ref 0.0–40.0)

## 2016-12-15 ENCOUNTER — Ambulatory Visit (INDEPENDENT_AMBULATORY_CARE_PROVIDER_SITE_OTHER): Payer: BLUE CROSS/BLUE SHIELD | Admitting: Primary Care

## 2016-12-15 VITALS — BP 106/66 | HR 54 | Temp 97.7°F | Ht 71.0 in | Wt 237.8 lb

## 2016-12-15 DIAGNOSIS — Z0001 Encounter for general adult medical examination with abnormal findings: Secondary | ICD-10-CM | POA: Diagnosis not present

## 2016-12-15 DIAGNOSIS — Z0000 Encounter for general adult medical examination without abnormal findings: Secondary | ICD-10-CM

## 2016-12-15 DIAGNOSIS — E785 Hyperlipidemia, unspecified: Secondary | ICD-10-CM

## 2016-12-15 DIAGNOSIS — M79671 Pain in right foot: Secondary | ICD-10-CM | POA: Diagnosis not present

## 2016-12-15 NOTE — Progress Notes (Signed)
Subjective:    Patient ID: Richard [name], male    DOB: 08/09/1970, 46 y.o.   MRN: [id number]  HPI  Richard [name] is a 46 year old male who presents today for complete physical.  Immunizations: -Tetanus: Completed in 2015 -Influenza: Completed in September.    Diet: He endorses a healthy diet. Following with the weight loss center at [place]: Protein Shake Lunch: Protein Shake, Protein Dinner:  Protein Shake, Protein Snacks: Almonds, cheese, yogurt  Desserts: None Beverages: Water, occasional coffee  Exercise: Working out with a Clinical research associate twice weekly, cardio twice weekly, walking. Eye exam: Completed in 2018 Dental exam: Completes annually  Wt Readings from Last 3 Encounters:  12/15/16 237 lb 12.8 oz (107.9 kg)  12/11/15 261 lb 12.8 oz (118.8 kg)  11/05/15 240 lb (108.9 kg)      Review of Systems  Constitutional: Negative for unexpected weight change.  HENT: Negative for rhinorrhea.   Respiratory: Negative for cough and shortness of breath.   Cardiovascular: Negative for chest pain.  Gastrointestinal: Negative for constipation and diarrhea.  Genitourinary: Negative for difficulty urinating.  Musculoskeletal: Positive for arthralgias. Negative for myalgias.       Right elbow pain, right heel pain.   Skin: Negative for rash.  Allergic/Immunologic: Negative for environmental allergies.  Neurological: Negative for dizziness, numbness and headaches.  Psychiatric/Behavioral:       Denies concerns for anxiety and depression       Past Medical History:  Diagnosis Date  . Medical history non-contributory      Social History   Social History  . Marital status: Divorced    Spouse name: N/A  . Number of children: N/A  . Years of education: N/A   Occupational History  . Not on file.   Social History Main Topics  . Smoking status: Never Smoker  . Smokeless tobacco: Not on file  . Alcohol use No  . Drug use: No  . Sexual activity: Yes    Partners:  Female   Other Topics Concern  . Not on file   Social History Narrative  . No narrative on file    Past Surgical History:  Procedure Laterality Date  . 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 CHOLECYSTECTOMY    . COSMETIC SURGERY Left    lipoma removal, arm/ forehead  . NASAL SEPTOPLASTY W/ TURBINOPLASTY Bilateral 10/10/2015   Procedure: NASAL SEPTOPLASTY WITH BILATERAL TURBINATE REDUCTION;  Surgeon: [name], MD;  Location: [place];  Service: ENT;  Laterality: Bilateral;  . UVULECTOMY N/A 10/10/2015   Procedure: Myrtis Ser ASSISTED;  Surgeon: [name], MD;  Location: [facility] OR;  Service: ENT;  Laterality: N/A;    Family History  Problem Relation Age of Onset  . Diabetes Mother   . Cancer Mother 48       breast, skin,lymphoma, deceased  . Cancer Father        prostate, skin  . Depression Sister   . Cancer Sister        sarcoidosis  . Depression Brother     Allergies  Allergen Reactions  . Ppd [Tuberculin Purified Protein Derivative] Other (See Comments)    + PPD NEG CXR 2/09    No current outpatient prescriptions on file prior to visit.   No current facility-administered medications on file prior to visit.     BP 106/66   Pulse (!) 54   Temp 97.7 F (36.5 C) (Oral)   Ht 5\' 11"  (1.803 m)  Wt 237 lb 12.8 oz (107.9 kg)   SpO2 98%   BMI 33.17 kg/m    Objective:   Physical Exam  Constitutional: He is oriented to person, place, and time. He appears well-nourished.  HENT:  Right Ear: Tympanic membrane and ear canal normal.  Left Ear: Tympanic membrane and ear canal normal.  Nose: Nose normal. Right sinus exhibits no maxillary sinus tenderness and no frontal sinus tenderness. Left sinus exhibits no maxillary sinus tenderness and no frontal sinus tenderness.  Mouth/Throat: Oropharynx is clear and moist.  Eyes: Pupils are equal, round, and reactive to light. Conjunctivae and EOM are normal.  Neck: Neck supple. Carotid bruit is not present. No thyromegaly  present.  Cardiovascular: Normal rate, regular rhythm and normal heart sounds.   Pulmonary/Chest: Effort normal and breath sounds normal. He has no wheezes. He has no rales.  Abdominal: Soft. Bowel sounds are normal. There is no tenderness.  Musculoskeletal: Normal range of motion.       Feet:  Mild tenderness to posterior ankle  Neurological: He is alert and oriented to person, place, and time. He has normal reflexes. No cranial nerve deficit.  Skin: Skin is warm and dry.  Psychiatric: He has a normal mood and affect.          Assessment & Plan:  Heel Pain:  Located to right posterior heel x 1 year. Symptoms worse in the AM, improved with walking throughout the day. Exam today with mild tenderness. Could be tendon involvement. Also suspect arthritis. He will follow up with Podiatry.  Sheral Flow, NP

## 2016-12-15 NOTE — Patient Instructions (Signed)
Continue exercising. You should be getting 150 minutes of moderate intensity exercise weekly.  Ensure you are consuming 64 ounces of water daily.  Congratulations on your weight loss!  Follow up in 1 year for your annual exam or sooner if needed.  It was a pleasure to see you today!

## 2016-12-15 NOTE — Assessment & Plan Note (Signed)
Improved on recent labs, weight loss of 24 pounds. Commended him on weight loss.

## 2016-12-15 NOTE — Assessment & Plan Note (Signed)
Immunizations UTD. Commended him on weight loss through healthy diet and exercise. He will continue to follow with the weight loss clinic. Continue regular exercise. Exam with mild tenderness to right posterior ankle.  Labs improved. Follow up in one year.

## 2016-12-24 DIAGNOSIS — F320 Major depressive disorder, single episode, mild: Secondary | ICD-10-CM | POA: Diagnosis not present

## 2016-12-31 DIAGNOSIS — F320 Major depressive disorder, single episode, mild: Secondary | ICD-10-CM | POA: Diagnosis not present

## 2017-01-07 DIAGNOSIS — F320 Major depressive disorder, single episode, mild: Secondary | ICD-10-CM | POA: Diagnosis not present

## 2017-01-21 DIAGNOSIS — F320 Major depressive disorder, single episode, mild: Secondary | ICD-10-CM | POA: Diagnosis not present

## 2017-01-28 DIAGNOSIS — F320 Major depressive disorder, single episode, mild: Secondary | ICD-10-CM | POA: Diagnosis not present

## 2017-02-04 DIAGNOSIS — F320 Major depressive disorder, single episode, mild: Secondary | ICD-10-CM | POA: Diagnosis not present

## 2017-02-11 DIAGNOSIS — F320 Major depressive disorder, single episode, mild: Secondary | ICD-10-CM | POA: Diagnosis not present

## 2017-02-17 DIAGNOSIS — F320 Major depressive disorder, single episode, mild: Secondary | ICD-10-CM | POA: Diagnosis not present

## 2017-02-25 DIAGNOSIS — F320 Major depressive disorder, single episode, mild: Secondary | ICD-10-CM | POA: Diagnosis not present

## 2017-03-04 DIAGNOSIS — F320 Major depressive disorder, single episode, mild: Secondary | ICD-10-CM | POA: Diagnosis not present

## 2017-03-25 DIAGNOSIS — F320 Major depressive disorder, single episode, mild: Secondary | ICD-10-CM | POA: Diagnosis not present

## 2017-03-26 DIAGNOSIS — Z1283 Encounter for screening for malignant neoplasm of skin: Secondary | ICD-10-CM | POA: Diagnosis not present

## 2017-03-26 DIAGNOSIS — D229 Melanocytic nevi, unspecified: Secondary | ICD-10-CM | POA: Diagnosis not present

## 2017-03-26 DIAGNOSIS — L720 Epidermal cyst: Secondary | ICD-10-CM | POA: Diagnosis not present

## 2017-03-26 DIAGNOSIS — D1800 Hemangioma unspecified site: Secondary | ICD-10-CM | POA: Diagnosis not present

## 2017-04-08 DIAGNOSIS — F320 Major depressive disorder, single episode, mild: Secondary | ICD-10-CM | POA: Diagnosis not present

## 2017-04-15 DIAGNOSIS — F320 Major depressive disorder, single episode, mild: Secondary | ICD-10-CM | POA: Diagnosis not present

## 2017-04-16 ENCOUNTER — Ambulatory Visit: Payer: BLUE CROSS/BLUE SHIELD | Admitting: Family Medicine

## 2017-04-16 ENCOUNTER — Encounter: Payer: Self-pay | Admitting: *Deleted

## 2017-04-16 ENCOUNTER — Other Ambulatory Visit: Payer: Self-pay

## 2017-04-16 ENCOUNTER — Encounter: Payer: Self-pay | Admitting: Family Medicine

## 2017-04-16 VITALS — BP 90/60 | HR 61 | Temp 97.9°F | Ht 71.0 in | Wt 238.8 lb

## 2017-04-16 DIAGNOSIS — H1089 Other conjunctivitis: Secondary | ICD-10-CM

## 2017-04-16 DIAGNOSIS — J069 Acute upper respiratory infection, unspecified: Secondary | ICD-10-CM

## 2017-04-16 DIAGNOSIS — F419 Anxiety disorder, unspecified: Secondary | ICD-10-CM | POA: Diagnosis not present

## 2017-04-16 MED ORDER — POLYMYXIN B-TRIMETHOPRIM 10000-0.1 UNIT/ML-% OP SOLN: 1.0000 [drp] | OPHTHALMIC | 0 refills | Status: DC

## 2017-04-16 NOTE — Progress Notes (Signed)
Dr. Frederico Hamman T. Mell Guia, MD, [facility] [place], [address] Phone: [phone] Fax: [phone]  04/16/2017  Patient: Richard [name], MRN: [id number], DOB: 28-Nov-1970, 48 y.o.  Primary Physician:  Pleas Koch, NP   Chief Complaint  Patient presents with  . Cough    with greensish phelgm  . Conjunctivitis   Subjective:   Richard [name] is a 47 y.o. very pleasant male patient who presents with the following:  B conjunctivitis. Son had it.   Very nice gentleman who has a cold with some runny nose and congestion and cough, and he also has some conjunctivitis.  His son had some conjunctivitis very recently and he was exposed to this.  Primarily in the right eye, with some crusting and dripping on the right eye and some pinkish conjunctivae more on the right, and to a lesser extent on the left.  Past Medical History, Surgical History, Social History, Family History, Problem List, Medications, and Allergies have been reviewed and updated if relevant.  Patient Active Problem List   Diagnosis Date Noted  . Preventative health care 12/11/2015  . Onychomycosis 10/18/2015  . Sleep apnea 10/10/2015  . Tinea pedis of both feet 07/29/2015  . Xerosis of skin 07/29/2015  . Toenail deformity 06/26/2015  . Lipoma 06/26/2015  . Chronic nasal congestion 06/26/2015  . Hyperlipidemia     Past Medical History:  Diagnosis Date  . Medical history non-contributory     Past Surgical History:  Procedure Laterality Date  . 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 CHOLECYSTECTOMY    . COSMETIC SURGERY Left    lipoma removal, arm/ forehead  . NASAL SEPTOPLASTY W/ TURBINOPLASTY Bilateral 10/10/2015   Procedure: NASAL SEPTOPLASTY WITH BILATERAL TURBINATE REDUCTION;  Surgeon: [name], MD;  Location: [place];  Service: ENT;  Laterality: Bilateral;  . UVULECTOMY N/A 10/10/2015   Procedure: Myrtis Ser ASSISTED;  Surgeon: [name],  MD;  Location: [place];  Service: ENT;  Laterality: N/A;    Social History   Socioeconomic History  . Marital status: Divorced    Spouse name: Not on file  . Number of children: Not on file  . Years of education: Not on file  . Highest education level: Not on file  Social Needs  . Financial resource strain: Not on file  . Food insecurity - worry: Not on file  . Food insecurity - inability: Not on file  . Transportation needs - medical: Not on file  . Transportation needs - non-medical: Not on file  Occupational History  . Not on file  Tobacco Use  . Smoking status: Never Smoker  . Smokeless tobacco: Never Used  Substance and Sexual Activity  . Alcohol use: No    Alcohol/week: 0.0 oz  . Drug use: No  . Sexual activity: Yes    Partners: Female  Other Topics Concern  . Not on file  Social History Narrative  . Not on file    Family History  Problem Relation Age of Onset  . Diabetes Mother   . Cancer Mother 15       breast, skin,lymphoma, deceased  . Cancer Father        prostate, skin  . Depression Sister   . Cancer Sister        sarcoidosis  . Depression Brother     Allergies  Allergen Reactions  . Ppd [Tuberculin Purified Protein Derivative] Other (See Comments)    +  PPD NEG CXR 2/09    Medication list reviewed and updated in full in [facility].  ROS: GEN: Acute illness details above GI: Tolerating PO intake GU: maintaining adequate hydration and urination Pulm: No SOB Interactive and getting along well at home.  Otherwise, ROS is as per the HPI.  Objective:   BP 90/60   Pulse 61   Temp 97.9 F (36.6 C) (Oral)   Ht 5\' 11"  (1.803 m)   Wt 238 lb 12 oz (108.3 kg)   SpO2 95%   BMI 33.30 kg/m    Gen: WDWN, NAD; A & O x3, cooperative. Pleasant.Globally Non-toxic HEENT: Normocephalic and atraumatic. Throat clear, w/o exudate, R TM clear, L TM - good landmarks, No fluid present. rhinnorhea.  MMM.  Conjunctive of the right eye is quite injected,  compared to the left eye.  There is also some dripping around the eye, more on the right. Frontal sinuses: NT [place] sinuses: NT NECK: Anterior cervical  LAD is absent CV: RRR, No M/G/R, cap refill <2 sec PULM: Breathing comfortably in no respiratory distress. no wheezing, crackles, rhonchi EXT: No c/c/e PSYCH: Friendly, good eye contact MSK: Nml gait     Laboratory and Imaging Data:  Assessment and Plan:   Other conjunctivitis of right eye  Acute URI  Supportive care for cold. Drops for pink eye  Follow-up: No Follow-up on file.  Meds ordered this encounter  Medications  . trimethoprim-polymyxin b (POLYTRIM) ophthalmic solution    Sig: Place 1 drop into the right eye every 4 (four) hours.    Dispense:  10 mL    Refill:  0    Signed,  Shahzain Kiester T. Der Gagliano, MD   Allergies as of 04/16/2017      Reactions   Ppd [tuberculin Purified Protein Derivative] Other (See Comments)   + PPD NEG CXR 2/09      Medication List        Accurate as of 04/16/17 11:59 PM. Always use your most recent med list.          FIBER ADULT GUMMIES PO Take by mouth.   multivitamin capsule Take 1 capsule by mouth daily.   trimethoprim-polymyxin b ophthalmic solution Commonly known as:  POLYTRIM Place 1 drop into the right eye every 4 (four) hours.

## 2017-04-20 DIAGNOSIS — F419 Anxiety disorder, unspecified: Secondary | ICD-10-CM | POA: Diagnosis not present

## 2017-04-22 DIAGNOSIS — F320 Major depressive disorder, single episode, mild: Secondary | ICD-10-CM | POA: Diagnosis not present

## 2017-04-29 DIAGNOSIS — F320 Major depressive disorder, single episode, mild: Secondary | ICD-10-CM | POA: Diagnosis not present

## 2017-05-06 DIAGNOSIS — F320 Major depressive disorder, single episode, mild: Secondary | ICD-10-CM | POA: Diagnosis not present

## 2017-05-13 DIAGNOSIS — F320 Major depressive disorder, single episode, mild: Secondary | ICD-10-CM | POA: Diagnosis not present

## 2017-05-22 DIAGNOSIS — F320 Major depressive disorder, single episode, mild: Secondary | ICD-10-CM | POA: Diagnosis not present

## 2017-05-27 DIAGNOSIS — F320 Major depressive disorder, single episode, mild: Secondary | ICD-10-CM | POA: Diagnosis not present

## 2017-06-03 DIAGNOSIS — F320 Major depressive disorder, single episode, mild: Secondary | ICD-10-CM | POA: Diagnosis not present

## 2017-06-09 DIAGNOSIS — L720 Epidermal cyst: Secondary | ICD-10-CM | POA: Diagnosis not present

## 2017-06-10 DIAGNOSIS — F320 Major depressive disorder, single episode, mild: Secondary | ICD-10-CM | POA: Diagnosis not present

## 2017-06-16 DIAGNOSIS — L720 Epidermal cyst: Secondary | ICD-10-CM | POA: Diagnosis not present

## 2017-06-17 DIAGNOSIS — F320 Major depressive disorder, single episode, mild: Secondary | ICD-10-CM | POA: Diagnosis not present

## 2017-07-01 DIAGNOSIS — F419 Anxiety disorder, unspecified: Secondary | ICD-10-CM | POA: Diagnosis not present

## 2017-07-01 DIAGNOSIS — F320 Major depressive disorder, single episode, mild: Secondary | ICD-10-CM | POA: Diagnosis not present

## 2017-07-15 DIAGNOSIS — F320 Major depressive disorder, single episode, mild: Secondary | ICD-10-CM | POA: Diagnosis not present

## 2017-07-22 DIAGNOSIS — F320 Major depressive disorder, single episode, mild: Secondary | ICD-10-CM | POA: Diagnosis not present

## 2017-07-29 DIAGNOSIS — F320 Major depressive disorder, single episode, mild: Secondary | ICD-10-CM | POA: Diagnosis not present

## 2017-08-12 DIAGNOSIS — F320 Major depressive disorder, single episode, mild: Secondary | ICD-10-CM | POA: Diagnosis not present

## 2017-08-19 DIAGNOSIS — F320 Major depressive disorder, single episode, mild: Secondary | ICD-10-CM | POA: Diagnosis not present

## 2017-08-26 DIAGNOSIS — F320 Major depressive disorder, single episode, mild: Secondary | ICD-10-CM | POA: Diagnosis not present

## 2017-09-02 DIAGNOSIS — F320 Major depressive disorder, single episode, mild: Secondary | ICD-10-CM | POA: Diagnosis not present

## 2017-09-09 DIAGNOSIS — F320 Major depressive disorder, single episode, mild: Secondary | ICD-10-CM | POA: Diagnosis not present

## 2017-09-16 DIAGNOSIS — F320 Major depressive disorder, single episode, mild: Secondary | ICD-10-CM | POA: Diagnosis not present

## 2017-09-23 DIAGNOSIS — F320 Major depressive disorder, single episode, mild: Secondary | ICD-10-CM | POA: Diagnosis not present

## 2017-09-30 DIAGNOSIS — F320 Major depressive disorder, single episode, mild: Secondary | ICD-10-CM | POA: Diagnosis not present

## 2017-10-07 DIAGNOSIS — F320 Major depressive disorder, single episode, mild: Secondary | ICD-10-CM | POA: Diagnosis not present

## 2017-10-21 DIAGNOSIS — F320 Major depressive disorder, single episode, mild: Secondary | ICD-10-CM | POA: Diagnosis not present

## 2017-11-04 DIAGNOSIS — F320 Major depressive disorder, single episode, mild: Secondary | ICD-10-CM | POA: Diagnosis not present

## 2017-11-11 DIAGNOSIS — F320 Major depressive disorder, single episode, mild: Secondary | ICD-10-CM | POA: Diagnosis not present

## 2017-11-18 DIAGNOSIS — F320 Major depressive disorder, single episode, mild: Secondary | ICD-10-CM | POA: Diagnosis not present

## 2017-11-25 DIAGNOSIS — F320 Major depressive disorder, single episode, mild: Secondary | ICD-10-CM | POA: Diagnosis not present

## 2017-12-02 DIAGNOSIS — F320 Major depressive disorder, single episode, mild: Secondary | ICD-10-CM | POA: Diagnosis not present

## 2017-12-03 ENCOUNTER — Ambulatory Visit: Payer: Self-pay

## 2017-12-08 ENCOUNTER — Other Ambulatory Visit: Payer: Self-pay | Admitting: Primary Care

## 2017-12-08 DIAGNOSIS — Z8042 Family history of malignant neoplasm of prostate: Secondary | ICD-10-CM

## 2017-12-08 DIAGNOSIS — Z0000 Encounter for general adult medical examination without abnormal findings: Secondary | ICD-10-CM

## 2017-12-08 DIAGNOSIS — E785 Hyperlipidemia, unspecified: Secondary | ICD-10-CM

## 2017-12-15 ENCOUNTER — Other Ambulatory Visit (INDEPENDENT_AMBULATORY_CARE_PROVIDER_SITE_OTHER): Payer: BLUE CROSS/BLUE SHIELD

## 2017-12-15 DIAGNOSIS — Z8042 Family history of malignant neoplasm of prostate: Secondary | ICD-10-CM | POA: Diagnosis not present

## 2017-12-15 DIAGNOSIS — E785 Hyperlipidemia, unspecified: Secondary | ICD-10-CM | POA: Diagnosis not present

## 2017-12-15 DIAGNOSIS — Z0000 Encounter for general adult medical examination without abnormal findings: Secondary | ICD-10-CM

## 2017-12-15 LAB — COMPREHENSIVE METABOLIC PANEL
ALT: 23 U/L (ref 0–53)
AST: 24 U/L (ref 0–37)
Albumin: 4.5 g/dL (ref 3.5–5.2)
Alkaline Phosphatase: 58 U/L (ref 39–117)
BUN: 21 mg/dL (ref 6–23)
CALCIUM: 9.7 mg/dL (ref 8.4–10.5)
CO2: 32 meq/L (ref 19–32)
Chloride: 102 mEq/L (ref 96–112)
Creatinine, Ser: 1.27 mg/dL (ref 0.40–1.50)
GFR: 64.14 mL/min (ref >60.00)
Glucose, Bld: 100 mg/dL — ABNORMAL HIGH (ref 70–99)
POTASSIUM: 4.5 meq/L (ref 3.5–5.1)
Sodium: 141 mEq/L (ref 135–145)
Total Bilirubin: 0.9 mg/dL (ref 0.2–1.2)
Total Protein: 6.4 g/dL (ref 6.0–8.3)

## 2017-12-15 LAB — LIPID PANEL
CHOL/HDL RATIO: 4
Cholesterol: 162 mg/dL (ref 0–200)
HDL: 37 mg/dL — AB (ref >39.00)
NonHDL: 124.58
Triglycerides: 285 mg/dL — ABNORMAL HIGH (ref 0.0–149.0)
VLDL: 57 mg/dL — AB (ref 0.0–40.0)

## 2017-12-15 LAB — HEMOGLOBIN A1C: Hgb A1c MFr Bld: 5.2 % (ref 4.6–6.5)

## 2017-12-15 LAB — LDL CHOLESTEROL, DIRECT: LDL DIRECT: 70 mg/dL

## 2017-12-15 LAB — PSA: PSA: 0.73 ng/mL (ref 0.10–4.00)

## 2017-12-16 DIAGNOSIS — F320 Major depressive disorder, single episode, mild: Secondary | ICD-10-CM | POA: Diagnosis not present

## 2017-12-17 ENCOUNTER — Encounter: Payer: BLUE CROSS/BLUE SHIELD | Admitting: Primary Care

## 2017-12-18 DIAGNOSIS — R635 Abnormal weight gain: Secondary | ICD-10-CM | POA: Diagnosis not present

## 2017-12-30 DIAGNOSIS — F320 Major depressive disorder, single episode, mild: Secondary | ICD-10-CM | POA: Diagnosis not present

## 2018-01-06 DIAGNOSIS — F320 Major depressive disorder, single episode, mild: Secondary | ICD-10-CM | POA: Diagnosis not present

## 2018-01-15 ENCOUNTER — Ambulatory Visit (INDEPENDENT_AMBULATORY_CARE_PROVIDER_SITE_OTHER)
Admission: RE | Admit: 2018-01-15 | Discharge: 2018-01-15 | Disposition: A | Discharge status: Home / Self Care | Payer: BLUE CROSS/BLUE SHIELD | Source: Ambulatory Visit | Attending: Primary Care | Admitting: Primary Care

## 2018-01-15 ENCOUNTER — Ambulatory Visit (INDEPENDENT_AMBULATORY_CARE_PROVIDER_SITE_OTHER): Payer: BLUE CROSS/BLUE SHIELD | Admitting: Primary Care

## 2018-01-15 ENCOUNTER — Encounter: Payer: Self-pay | Admitting: Primary Care

## 2018-01-15 VITALS — BP 120/76 | HR 52 | Temp 98.0°F | Ht 71.0 in | Wt 243.2 lb

## 2018-01-15 DIAGNOSIS — M79671 Pain in right foot: Secondary | ICD-10-CM

## 2018-01-15 DIAGNOSIS — Z0000 Encounter for general adult medical examination without abnormal findings: Secondary | ICD-10-CM

## 2018-01-15 DIAGNOSIS — G8929 Other chronic pain: Secondary | ICD-10-CM

## 2018-01-15 DIAGNOSIS — M79673 Pain in unspecified foot: Secondary | ICD-10-CM

## 2018-01-15 DIAGNOSIS — E785 Hyperlipidemia, unspecified: Secondary | ICD-10-CM

## 2018-01-15 DIAGNOSIS — E669 Obesity, unspecified: Secondary | ICD-10-CM | POA: Diagnosis not present

## 2018-01-15 DIAGNOSIS — M25571 Pain in right ankle and joints of right foot: Secondary | ICD-10-CM | POA: Diagnosis not present

## 2018-01-15 NOTE — Assessment & Plan Note (Signed)
Immunizations UTD. PSA UTD. Commended him on regular exercise and a healthy diet, encouraged to continue. Exam unremarkable. Labs reviewed. Follow up in 1 year for CPE.

## 2018-01-15 NOTE — Progress Notes (Signed)
Subjective:    Patient ID: Richard [name], male    DOB: [birth year], 47 y.o.   MRN: [id number]  HPI  Richard [name] is a 47 year old male who presents today for complete physical.  Right posterior heel/achilles pain that has been chronic for the last 2-3 years. Daily pain with walking/standing. He denies recent re-injury. He does not use inserts to his shoes. He has a history of plantar fasciitis but this does not feel similar.   Immunizations: -Tetanus: Completed in 2015 -Influenza: Completed this season   Diet: He endorses a healthy diet Breakfast: Protein shake Lunch: Protein, vegetable Dinner: Protein, vegetable, occasional restaurants  Snacks: Protein bar, eggs, nuts Desserts: Occasionally  Beverages: Water, diet soda, G2 Gatorade, coffee  Exercise: He is doing crossfit and cardio training 25 minutes three days weekly. Eye exam: Completed years ago Dental exam: Completes semi-annually   BP Readings from Last 3 Encounters:  01/15/18 120/76  04/16/17 90/60  12/15/16 106/66    Wt Readings from Last 3 Encounters:  01/15/18 243 lb 4 oz (110.3 kg)  04/16/17 238 lb 12 oz (108.3 kg)  12/15/16 237 lb 12.8 oz (107.9 kg)    Review of Systems  Constitutional: Negative for unexpected weight change.  HENT: Negative for rhinorrhea.   Respiratory: Negative for cough and shortness of breath.   Cardiovascular: Negative for chest pain.  Gastrointestinal: Negative for constipation and diarrhea.  Genitourinary: Negative for difficulty urinating.  Musculoskeletal: Positive for arthralgias. Negative for myalgias.       Chronic right posterior ankle/heel pain  Skin: Negative for rash.  Allergic/Immunologic: Negative for environmental allergies.  Neurological: Negative for dizziness, numbness and headaches.  Psychiatric/Behavioral: The patient is not nervous/anxious.        Past Medical History:  Diagnosis Date  . Medical history non-contributory      Social History   Socioeconomic  History  . Marital status: Divorced    Spouse name: Not on file  . Number of children: Not on file  . Years of education: Not on file  . Highest education level: Not on file  Occupational History  . Not on file  Social Needs  . Financial resource strain: Not on file  . Food insecurity:    Worry: Not on file    Inability: Not on file  . Transportation needs:    Medical: Not on file    Non-medical: Not on file  Tobacco Use  . Smoking status: Never Smoker  . Smokeless tobacco: Never Used  Substance and Sexual Activity  . Alcohol use: No    Alcohol/week: 0.0 standard drinks  . Drug use: No  . Sexual activity: Yes    Partners: Female  Lifestyle  . Physical activity:    Days per week: Not on file    Minutes per session: Not on file  . Stress: Not on file  Relationships  . Social connections:    Talks on phone: Not on file    Gets together: Not on file    Attends religious service: Not on file    Active member of club or organization: Not on file    Attends meetings of clubs or organizations: Not on file    Relationship status: Not on file  . Intimate partner violence:    Fear of current or ex partner: Not on file    Emotionally abused: Not on file    Physically abused: Not on file    Forced sexual activity: Not on file  Other  Topics Concern  . Not on file  Social History Narrative  . Not on file    Past Surgical History:  Procedure Laterality Date  . 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 CHOLECYSTECTOMY    . COSMETIC SURGERY Left    lipoma removal, arm/ forehead  . NASAL SEPTOPLASTY W/ TURBINOPLASTY Bilateral 10/10/2015   Procedure: NASAL SEPTOPLASTY WITH BILATERAL TURBINATE REDUCTION;  Surgeon: [name], MD;  Location: [facility];  Service: ENT;  Laterality: Bilateral;  . UVULECTOMY N/A 10/10/2015   Procedure: Myrtis Ser ASSISTED;  Surgeon: [name], MD;  Location: [facility];  Service: ENT;  Laterality: N/A;    Family History  Problem Relation Age of Onset    . Diabetes Mother   . Cancer Mother 67       breast, skin,lymphoma, deceased  . Cancer Father        prostate, skin  . Depression Sister   . Cancer Sister        sarcoidosis  . Depression Brother     Allergies  Allergen Reactions  . Ppd [Tuberculin Purified Protein Derivative] Other (See Comments)    + PPD NEG CXR 2/09    Current Outpatient Medications on File Prior to Visit  Medication Sig Dispense Refill  . buPROPion (WELLBUTRIN XL) 150 MG 24 hr tablet Take 150 mg by mouth daily.     Marland Kitchen FIBER ADULT GUMMIES PO Take by mouth.    . Multiple Vitamin (MULTIVITAMIN) capsule Take 1 capsule by mouth daily.     No current facility-administered medications on file prior to visit.     BP 120/76   Pulse (!) 52   Temp 98 F (36.7 C) (Oral)   Ht 5\' 11"  (1.803 m)   Wt 243 lb 4 oz (110.3 kg)   SpO2 97%   BMI 33.93 kg/m    Objective:   Physical Exam  Constitutional: He is oriented to person, place, and time. He appears well-nourished.  HENT:  Mouth/Throat: No oropharyngeal exudate.  Eyes: Pupils are equal, round, and reactive to light. EOM are normal.  Neck: Neck supple. No thyromegaly present.  Cardiovascular: Normal rate and regular rhythm.  Respiratory: Effort normal and breath sounds normal.  GI: Soft. Bowel sounds are normal. There is no tenderness.  Musculoskeletal: Normal range of motion.       Right ankle: He exhibits normal range of motion and no swelling. No tenderness. Achilles tendon exhibits no pain.       Feet:  No abnormality on exam. Pian located to area noted in picture.  Neurological: He is alert and oriented to person, place, and time.  Skin: Skin is warm and dry.  Psychiatric: He has a normal mood and affect.           Assessment & Plan:

## 2018-01-15 NOTE — Assessment & Plan Note (Signed)
Overall stable, trigs elevated. Discussed to continue with regular exercise and healthy diet.

## 2018-01-15 NOTE — Assessment & Plan Note (Addendum)
Initiated on Wellbutrin XL 150 mg several weeks ago per weight loss clinic through [facility]. Overall doing well.  He is exercising, commended him on this. Discussed to continue to work on diet.

## 2018-01-15 NOTE — Patient Instructions (Signed)
Complete xray(s) prior to leaving today. I will notify you of your results once received.  Continue exercising. You should be getting 150 minutes of moderate intensity exercise weekly.  Continue to eat a healthy diet. Ensure you are consuming 64 ounces of water daily.  We will see you in one year for your annual exam or sooner if needed.  It was a pleasure to see you today!   Preventive Care 40-64 Years, Male Preventive care refers to lifestyle choices and visits with your health care provider that can promote health and wellness. What does preventive care include?  A yearly physical exam. This is also called an annual well check.  Dental exams once or twice a year.  Routine eye exams. Ask your health care provider how often you should have your eyes checked.  Personal lifestyle choices, including: ? Daily care of your teeth and gums. ? Regular physical activity. ? Eating a healthy diet. ? Avoiding tobacco and drug use. ? Limiting alcohol use. ? Practicing safe sex. ? Taking low-dose aspirin every day starting at age 66. What happens during an annual well check? The services and screenings done by your health care provider during your annual well check will depend on your age, overall health, lifestyle risk factors, and family history of disease. Counseling Your health care provider may ask you questions about your:  Alcohol use.  Tobacco use.  Drug use.  Emotional well-being.  Home and relationship well-being.  Sexual activity.  Eating habits.  Work and work Statistician.  Screening You may have the following tests or measurements:  Height, weight, and BMI.  Blood pressure.  Lipid and cholesterol levels. These may be checked every 5 years, or more frequently if you are over 10 years old.  Skin check.  Lung cancer screening. You may have this screening every year starting at age 42 if you have a 30-pack-year history of smoking and currently smoke or have  quit within the past 15 years.  Fecal occult blood test (FOBT) of the stool. You may have this test every year starting at age 20.  Flexible sigmoidoscopy or colonoscopy. You may have a sigmoidoscopy every 5 years or a colonoscopy every 10 years starting at age 27.  Prostate cancer screening. Recommendations will vary depending on your family history and other risks.  Hepatitis C blood test.  Hepatitis B blood test.  Sexually transmitted disease (STD) testing.  Diabetes screening. This is done by checking your blood sugar (glucose) after you have not eaten for a while (fasting). You may have this done every 1-3 years.  Discuss your test results, treatment options, and if necessary, the need for more tests with your health care provider. Vaccines Your health care provider may recommend certain vaccines, such as:  Influenza vaccine. This is recommended every year.  Tetanus, diphtheria, and acellular pertussis (Tdap, Td) vaccine. You may need a Td booster every 10 years.  Varicella vaccine. You may need this if you have not been vaccinated.  Zoster vaccine. You may need this after age 3.  Measles, mumps, and rubella (MMR) vaccine. You may need at least one dose of MMR if you were born in 1957 or later. You may also need a second dose.  Pneumococcal 13-valent conjugate (PCV13) vaccine. You may need this if you have certain conditions and have not been vaccinated.  Pneumococcal polysaccharide (PPSV23) vaccine. You may need one or two doses if you smoke cigarettes or if you have certain conditions.  Meningococcal vaccine. You may  need this if you have certain conditions.  Hepatitis A vaccine. You may need this if you have certain conditions or if you travel or work in places where you may be exposed to hepatitis A.  Hepatitis B vaccine. You may need this if you have certain conditions or if you travel or work in places where you may be exposed to hepatitis B.  Haemophilus  influenzae type b (Hib) vaccine. You may need this if you have certain risk factors.  Talk to your health care provider about which screenings and vaccines you need and how often you need them. This information is not intended to replace advice given to you by your health care provider. Make sure you discuss any questions you have with your health care provider. Document Released: 03/30/2015 Document Revised: 11/21/2015 Document Reviewed: 01/02/2015 Elsevier Interactive Patient Education  Richard [name].

## 2018-01-15 NOTE — Assessment & Plan Note (Signed)
Located to right posterior ankle at base of achilles tendon x several years. No recent re-injury. Check plain films, discussed sports medicine evaluation.

## 2018-01-19 DIAGNOSIS — F320 Major depressive disorder, single episode, mild: Secondary | ICD-10-CM | POA: Diagnosis not present

## 2018-01-27 DIAGNOSIS — F320 Major depressive disorder, single episode, mild: Secondary | ICD-10-CM | POA: Diagnosis not present

## 2018-02-03 DIAGNOSIS — F320 Major depressive disorder, single episode, mild: Secondary | ICD-10-CM | POA: Diagnosis not present

## 2018-02-08 DIAGNOSIS — M5414 Radiculopathy, thoracic region: Secondary | ICD-10-CM | POA: Diagnosis not present

## 2018-02-08 DIAGNOSIS — M9903 Segmental and somatic dysfunction of lumbar region: Secondary | ICD-10-CM | POA: Diagnosis not present

## 2018-02-08 DIAGNOSIS — M5136 Other intervertebral disc degeneration, lumbar region: Secondary | ICD-10-CM | POA: Diagnosis not present

## 2018-02-08 DIAGNOSIS — M9902 Segmental and somatic dysfunction of thoracic region: Secondary | ICD-10-CM | POA: Diagnosis not present

## 2018-02-09 DIAGNOSIS — M9903 Segmental and somatic dysfunction of lumbar region: Secondary | ICD-10-CM | POA: Diagnosis not present

## 2018-02-09 DIAGNOSIS — M5136 Other intervertebral disc degeneration, lumbar region: Secondary | ICD-10-CM | POA: Diagnosis not present

## 2018-02-09 DIAGNOSIS — M5414 Radiculopathy, thoracic region: Secondary | ICD-10-CM | POA: Diagnosis not present

## 2018-02-09 DIAGNOSIS — M9902 Segmental and somatic dysfunction of thoracic region: Secondary | ICD-10-CM | POA: Diagnosis not present

## 2018-02-10 DIAGNOSIS — M5414 Radiculopathy, thoracic region: Secondary | ICD-10-CM | POA: Diagnosis not present

## 2018-02-10 DIAGNOSIS — M9903 Segmental and somatic dysfunction of lumbar region: Secondary | ICD-10-CM | POA: Diagnosis not present

## 2018-02-10 DIAGNOSIS — M9902 Segmental and somatic dysfunction of thoracic region: Secondary | ICD-10-CM | POA: Diagnosis not present

## 2018-02-10 DIAGNOSIS — F320 Major depressive disorder, single episode, mild: Secondary | ICD-10-CM | POA: Diagnosis not present

## 2018-02-10 DIAGNOSIS — M5136 Other intervertebral disc degeneration, lumbar region: Secondary | ICD-10-CM | POA: Diagnosis not present

## 2018-02-17 DIAGNOSIS — M5136 Other intervertebral disc degeneration, lumbar region: Secondary | ICD-10-CM | POA: Diagnosis not present

## 2018-02-17 DIAGNOSIS — M9902 Segmental and somatic dysfunction of thoracic region: Secondary | ICD-10-CM | POA: Diagnosis not present

## 2018-02-17 DIAGNOSIS — F320 Major depressive disorder, single episode, mild: Secondary | ICD-10-CM | POA: Diagnosis not present

## 2018-02-17 DIAGNOSIS — M5414 Radiculopathy, thoracic region: Secondary | ICD-10-CM | POA: Diagnosis not present

## 2018-02-17 DIAGNOSIS — M9903 Segmental and somatic dysfunction of lumbar region: Secondary | ICD-10-CM | POA: Diagnosis not present

## 2018-02-18 DIAGNOSIS — M5136 Other intervertebral disc degeneration, lumbar region: Secondary | ICD-10-CM | POA: Diagnosis not present

## 2018-02-18 DIAGNOSIS — M9903 Segmental and somatic dysfunction of lumbar region: Secondary | ICD-10-CM | POA: Diagnosis not present

## 2018-02-18 DIAGNOSIS — M9902 Segmental and somatic dysfunction of thoracic region: Secondary | ICD-10-CM | POA: Diagnosis not present

## 2018-02-18 DIAGNOSIS — M5414 Radiculopathy, thoracic region: Secondary | ICD-10-CM | POA: Diagnosis not present

## 2018-02-22 DIAGNOSIS — M5136 Other intervertebral disc degeneration, lumbar region: Secondary | ICD-10-CM | POA: Diagnosis not present

## 2018-02-22 DIAGNOSIS — M5414 Radiculopathy, thoracic region: Secondary | ICD-10-CM | POA: Diagnosis not present

## 2018-02-22 DIAGNOSIS — M9903 Segmental and somatic dysfunction of lumbar region: Secondary | ICD-10-CM | POA: Diagnosis not present

## 2018-02-22 DIAGNOSIS — M9902 Segmental and somatic dysfunction of thoracic region: Secondary | ICD-10-CM | POA: Diagnosis not present

## 2018-02-24 DIAGNOSIS — F320 Major depressive disorder, single episode, mild: Secondary | ICD-10-CM | POA: Diagnosis not present

## 2018-02-25 DIAGNOSIS — M9903 Segmental and somatic dysfunction of lumbar region: Secondary | ICD-10-CM | POA: Diagnosis not present

## 2018-02-25 DIAGNOSIS — M9902 Segmental and somatic dysfunction of thoracic region: Secondary | ICD-10-CM | POA: Diagnosis not present

## 2018-02-25 DIAGNOSIS — M5136 Other intervertebral disc degeneration, lumbar region: Secondary | ICD-10-CM | POA: Diagnosis not present

## 2018-02-25 DIAGNOSIS — M5414 Radiculopathy, thoracic region: Secondary | ICD-10-CM | POA: Diagnosis not present

## 2018-03-01 DIAGNOSIS — M9903 Segmental and somatic dysfunction of lumbar region: Secondary | ICD-10-CM | POA: Diagnosis not present

## 2018-03-01 DIAGNOSIS — M9902 Segmental and somatic dysfunction of thoracic region: Secondary | ICD-10-CM | POA: Diagnosis not present

## 2018-03-01 DIAGNOSIS — M5414 Radiculopathy, thoracic region: Secondary | ICD-10-CM | POA: Diagnosis not present

## 2018-03-01 DIAGNOSIS — M5136 Other intervertebral disc degeneration, lumbar region: Secondary | ICD-10-CM | POA: Diagnosis not present

## 2018-03-03 DIAGNOSIS — M5414 Radiculopathy, thoracic region: Secondary | ICD-10-CM | POA: Diagnosis not present

## 2018-03-03 DIAGNOSIS — F320 Major depressive disorder, single episode, mild: Secondary | ICD-10-CM | POA: Diagnosis not present

## 2018-03-03 DIAGNOSIS — M9903 Segmental and somatic dysfunction of lumbar region: Secondary | ICD-10-CM | POA: Diagnosis not present

## 2018-03-03 DIAGNOSIS — M9902 Segmental and somatic dysfunction of thoracic region: Secondary | ICD-10-CM | POA: Diagnosis not present

## 2018-03-03 DIAGNOSIS — M5136 Other intervertebral disc degeneration, lumbar region: Secondary | ICD-10-CM | POA: Diagnosis not present

## 2018-03-04 DIAGNOSIS — M5414 Radiculopathy, thoracic region: Secondary | ICD-10-CM | POA: Diagnosis not present

## 2018-03-04 DIAGNOSIS — M5136 Other intervertebral disc degeneration, lumbar region: Secondary | ICD-10-CM | POA: Diagnosis not present

## 2018-03-04 DIAGNOSIS — M9902 Segmental and somatic dysfunction of thoracic region: Secondary | ICD-10-CM | POA: Diagnosis not present

## 2018-03-04 DIAGNOSIS — M9903 Segmental and somatic dysfunction of lumbar region: Secondary | ICD-10-CM | POA: Diagnosis not present

## 2018-03-12 DIAGNOSIS — M9902 Segmental and somatic dysfunction of thoracic region: Secondary | ICD-10-CM | POA: Diagnosis not present

## 2018-03-12 DIAGNOSIS — M5414 Radiculopathy, thoracic region: Secondary | ICD-10-CM | POA: Diagnosis not present

## 2018-03-12 DIAGNOSIS — M5136 Other intervertebral disc degeneration, lumbar region: Secondary | ICD-10-CM | POA: Diagnosis not present

## 2018-03-12 DIAGNOSIS — M9903 Segmental and somatic dysfunction of lumbar region: Secondary | ICD-10-CM | POA: Diagnosis not present

## 2018-03-16 DIAGNOSIS — M5136 Other intervertebral disc degeneration, lumbar region: Secondary | ICD-10-CM | POA: Diagnosis not present

## 2018-03-16 DIAGNOSIS — M5414 Radiculopathy, thoracic region: Secondary | ICD-10-CM | POA: Diagnosis not present

## 2018-03-16 DIAGNOSIS — M9903 Segmental and somatic dysfunction of lumbar region: Secondary | ICD-10-CM | POA: Diagnosis not present

## 2018-03-16 DIAGNOSIS — M9902 Segmental and somatic dysfunction of thoracic region: Secondary | ICD-10-CM | POA: Diagnosis not present

## 2018-03-18 DIAGNOSIS — F320 Major depressive disorder, single episode, mild: Secondary | ICD-10-CM | POA: Diagnosis not present

## 2018-03-25 DIAGNOSIS — M9903 Segmental and somatic dysfunction of lumbar region: Secondary | ICD-10-CM | POA: Diagnosis not present

## 2018-03-25 DIAGNOSIS — M5414 Radiculopathy, thoracic region: Secondary | ICD-10-CM | POA: Diagnosis not present

## 2018-03-25 DIAGNOSIS — M9902 Segmental and somatic dysfunction of thoracic region: Secondary | ICD-10-CM | POA: Diagnosis not present

## 2018-03-25 DIAGNOSIS — M5136 Other intervertebral disc degeneration, lumbar region: Secondary | ICD-10-CM | POA: Diagnosis not present

## 2018-03-31 DIAGNOSIS — F320 Major depressive disorder, single episode, mild: Secondary | ICD-10-CM | POA: Diagnosis not present

## 2018-04-14 DIAGNOSIS — F320 Major depressive disorder, single episode, mild: Secondary | ICD-10-CM | POA: Diagnosis not present

## 2018-04-21 DIAGNOSIS — F320 Major depressive disorder, single episode, mild: Secondary | ICD-10-CM | POA: Diagnosis not present

## 2018-04-28 DIAGNOSIS — F320 Major depressive disorder, single episode, mild: Secondary | ICD-10-CM | POA: Diagnosis not present

## 2018-05-05 DIAGNOSIS — F320 Major depressive disorder, single episode, mild: Secondary | ICD-10-CM | POA: Diagnosis not present

## 2018-05-10 DIAGNOSIS — R7989 Other specified abnormal findings of blood chemistry: Secondary | ICD-10-CM | POA: Diagnosis not present

## 2018-05-10 DIAGNOSIS — R1033 Periumbilical pain: Secondary | ICD-10-CM | POA: Diagnosis not present

## 2018-05-10 DIAGNOSIS — R1013 Epigastric pain: Secondary | ICD-10-CM | POA: Diagnosis not present

## 2018-05-10 DIAGNOSIS — R945 Abnormal results of liver function studies: Secondary | ICD-10-CM | POA: Diagnosis not present

## 2018-05-10 DIAGNOSIS — R109 Unspecified abdominal pain: Secondary | ICD-10-CM | POA: Diagnosis not present

## 2018-05-10 DIAGNOSIS — R197 Diarrhea, unspecified: Secondary | ICD-10-CM | POA: Diagnosis not present

## 2018-05-10 DIAGNOSIS — R140 Abdominal distension (gaseous): Secondary | ICD-10-CM | POA: Diagnosis not present

## 2018-05-10 DIAGNOSIS — N281 Cyst of kidney, acquired: Secondary | ICD-10-CM | POA: Diagnosis not present

## 2018-05-10 DIAGNOSIS — K4020 Bilateral inguinal hernia, without obstruction or gangrene, not specified as recurrent: Secondary | ICD-10-CM | POA: Diagnosis not present

## 2018-05-10 DIAGNOSIS — K5730 Diverticulosis of large intestine without perforation or abscess without bleeding: Secondary | ICD-10-CM | POA: Diagnosis not present

## 2018-05-12 ENCOUNTER — Encounter: Payer: Self-pay | Admitting: Primary Care

## 2018-05-12 ENCOUNTER — Ambulatory Visit: Payer: BLUE CROSS/BLUE SHIELD | Admitting: Primary Care

## 2018-05-12 VITALS — BP 120/72 | HR 80 | Temp 98.2°F | Ht 71.0 in | Wt 265.8 lb

## 2018-05-12 DIAGNOSIS — R7989 Other specified abnormal findings of blood chemistry: Secondary | ICD-10-CM

## 2018-05-12 DIAGNOSIS — R945 Abnormal results of liver function studies: Secondary | ICD-10-CM

## 2018-05-12 DIAGNOSIS — F320 Major depressive disorder, single episode, mild: Secondary | ICD-10-CM | POA: Diagnosis not present

## 2018-05-12 NOTE — Progress Notes (Signed)
Subjective:    Patient ID: Richard [name], male    DOB: [birth year], 48 y.o.   MRN: [id number]  HPI  Richard [name] is a 48 year old male with history of cholecystectomy who presents today with a chief complaint of abdominal pain.  He presented to [facility] ED on 05/10/18 with a chief complaint of abdominal pain. He endorsed cramping around the naval with dyspnea and fatigue, diarrhea. He had taken Imodium with some improvement in diarrhea. During his stay in the ED he underwent xray of the abdomen which was negative. He also underwent CT abdomen/pelvis with mild diverticulosis without diverticulitis, no acute abnormality. ECG with NSR, RBBB. Labs including AST (215) and ALT (132) were elevated. Other labs unremarkable. He denied excessive ETOH or Tylenol use. He was treated with oral percocet and discharged home.   Since his ED visit he continues to experience abdominal pain. His pain is located to the upper and mid abdominal region bilaterally. He describes his pain as crampy His last bowel movement was three days ago, also little urine output. He is drinking Gatorade mostly, eating crackers, chicken broth, and bread and has not vomited.   Review of Systems  Constitutional: Negative for fever.  Gastrointestinal: Positive for abdominal pain and constipation. Negative for blood in stool, diarrhea, nausea and vomiting.       Past Medical History:  Diagnosis Date  . Diverticulosis   . Medical history non-contributory      Social History   Socioeconomic History  . Marital status: Divorced    Spouse name: Not on file  . Number of children: Not on file  . Years of education: Not on file  . Highest education level: Not on file  Occupational History  . Not on file  Social Needs  . Financial resource strain: Not on file  . Food insecurity:    Worry: Not on file    Inability: Not on file  . Transportation needs:    Medical: Not on file    Non-medical: Not on file  Tobacco Use  . Smoking status:  Never Smoker  . Smokeless tobacco: Never Used  Substance and Sexual Activity  . Alcohol use: No    Alcohol/week: 0.0 standard drinks  . Drug use: No  . Sexual activity: Yes    Partners: Female  Lifestyle  . Physical activity:    Days per week: Not on file    Minutes per session: Not on file  . Stress: Not on file  Relationships  . Social connections:    Talks on phone: Not on file    Gets together: Not on file    Attends religious service: Not on file    Active member of club or organization: Not on file    Attends meetings of clubs or organizations: Not on file    Relationship status: Not on file  . Intimate partner violence:    Fear of current or ex partner: Not on file    Emotionally abused: Not on file    Physically abused: Not on file    Forced sexual activity: Not on file  Other Topics Concern  . Not on file  Social History Narrative  . Not on file    Past Surgical History:  Procedure Laterality Date  . 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 CHOLECYSTECTOMY    . COSMETIC SURGERY Left    lipoma removal, arm/ forehead  . NASAL SEPTOPLASTY W/ TURBINOPLASTY Bilateral 10/10/2015   Procedure:  NASAL SEPTOPLASTY WITH BILATERAL TURBINATE REDUCTION;  Surgeon: [name], MD;  Location: [place];  Service: ENT;  Laterality: Bilateral;  . UVULECTOMY N/A 10/10/2015   Procedure: Myrtis Ser ASSISTED;  Surgeon: [name], MD;  Location: [facility];  Service: ENT;  Laterality: N/A;    Family History  Problem Relation Age of Onset  . Diabetes Mother   . Cancer Mother 31       breast, skin,lymphoma, deceased  . Cancer Father        prostate, skin  . Depression Sister   . Cancer Sister        sarcoidosis  . Depression Brother     Allergies  Allergen Reactions  . Ppd [Tuberculin Purified Protein Derivative] Other (See Comments)    + PPD NEG CXR 2/09    Current Outpatient Medications on File Prior to Visit  Medication Sig Dispense Refill  . buPROPion (WELLBUTRIN XL)  150 MG 24 hr tablet Take 150 mg by mouth daily.     Marland Kitchen FIBER ADULT GUMMIES PO Take by mouth.    . Multiple Vitamin (MULTIVITAMIN) capsule Take 1 capsule by mouth daily.     No current facility-administered medications on file prior to visit.     BP 120/72   Pulse 80   Temp 98.2 F (36.8 C) (Oral)   Ht 5\' 11"  (1.803 m)   Wt 265 lb 12 oz (120.5 kg)   SpO2 97%   BMI 37.06 kg/m    Objective:   Physical Exam  Constitutional: He appears well-nourished.  Neck: Neck supple.  Cardiovascular: Normal rate and regular rhythm.  Respiratory: Effort normal and breath sounds normal.  GI: Soft. Normal appearance and bowel sounds are normal. There is abdominal tenderness in the epigastric area and left upper quadrant.  Skin: Skin is warm and dry.           Assessment & Plan:  Abdominal Pain:  Present for the last 3 days, evaluated in the ED at [facility], records reviewed. Suspect viral etiology for symptoms and will continue to treat conservatively. Discussed to avoid Imodium. Can try Miralax if needed for constipation. Avoid NSAID's for potential GI upset.  Discussed to hydrate with water and rest. Return precautions provided. Will repeat liver enzymes in 2 weeks.  Pleas Koch, NP

## 2018-05-12 NOTE — Patient Instructions (Signed)
Ensure you are consuming 64 ounces of water daily.  Advance your diet as tolerated.  You can try Miralax as needed for constipation, use cautiously.   Schedule a lab only appointment for 2 weeks to recheck liver enzymes.  It was a pleasure to see you today!

## 2018-05-19 DIAGNOSIS — F320 Major depressive disorder, single episode, mild: Secondary | ICD-10-CM | POA: Diagnosis not present

## 2018-05-24 ENCOUNTER — Other Ambulatory Visit (INDEPENDENT_AMBULATORY_CARE_PROVIDER_SITE_OTHER): Payer: BLUE CROSS/BLUE SHIELD

## 2018-05-24 DIAGNOSIS — R945 Abnormal results of liver function studies: Secondary | ICD-10-CM

## 2018-05-24 DIAGNOSIS — R7989 Other specified abnormal findings of blood chemistry: Secondary | ICD-10-CM

## 2018-05-24 LAB — HEPATIC FUNCTION PANEL
ALT: 24 U/L (ref 0–53)
AST: 14 U/L (ref 0–37)
Albumin: 4.5 g/dL (ref 3.5–5.2)
Alkaline Phosphatase: 62 U/L (ref 39–117)
Bilirubin, Direct: 0 mg/dL (ref 0.0–0.3)
TOTAL PROTEIN: 6.5 g/dL (ref 6.0–8.3)
Total Bilirubin: 0.5 mg/dL (ref 0.2–1.2)

## 2018-05-26 DIAGNOSIS — F320 Major depressive disorder, single episode, mild: Secondary | ICD-10-CM | POA: Diagnosis not present

## 2018-06-02 DIAGNOSIS — F320 Major depressive disorder, single episode, mild: Secondary | ICD-10-CM | POA: Diagnosis not present

## 2018-06-10 DIAGNOSIS — F320 Major depressive disorder, single episode, mild: Secondary | ICD-10-CM | POA: Diagnosis not present

## 2018-06-16 DIAGNOSIS — F320 Major depressive disorder, single episode, mild: Secondary | ICD-10-CM | POA: Diagnosis not present

## 2018-06-23 DIAGNOSIS — F320 Major depressive disorder, single episode, mild: Secondary | ICD-10-CM | POA: Diagnosis not present

## 2018-06-30 DIAGNOSIS — F320 Major depressive disorder, single episode, mild: Secondary | ICD-10-CM | POA: Diagnosis not present

## 2018-07-07 DIAGNOSIS — F320 Major depressive disorder, single episode, mild: Secondary | ICD-10-CM | POA: Diagnosis not present

## 2018-07-14 DIAGNOSIS — F320 Major depressive disorder, single episode, mild: Secondary | ICD-10-CM | POA: Diagnosis not present

## 2018-07-21 DIAGNOSIS — F320 Major depressive disorder, single episode, mild: Secondary | ICD-10-CM | POA: Diagnosis not present

## 2018-07-28 DIAGNOSIS — F320 Major depressive disorder, single episode, mild: Secondary | ICD-10-CM | POA: Diagnosis not present

## 2018-08-03 DIAGNOSIS — F4320 Adjustment disorder, unspecified: Secondary | ICD-10-CM | POA: Diagnosis not present

## 2018-08-04 DIAGNOSIS — F320 Major depressive disorder, single episode, mild: Secondary | ICD-10-CM | POA: Diagnosis not present

## 2018-08-10 NOTE — Telephone Encounter (Signed)
Please call patient regarding his question, thanks!

## 2018-08-10 NOTE — Telephone Encounter (Signed)
Message left for patient to return my call.  

## 2018-08-10 NOTE — Telephone Encounter (Signed)
Spoken to patient and explain about testing for covid19

## 2018-08-11 DIAGNOSIS — F4320 Adjustment disorder, unspecified: Secondary | ICD-10-CM | POA: Diagnosis not present

## 2018-08-11 DIAGNOSIS — F320 Major depressive disorder, single episode, mild: Secondary | ICD-10-CM | POA: Diagnosis not present

## 2018-08-16 DIAGNOSIS — L0390 Cellulitis, unspecified: Secondary | ICD-10-CM | POA: Diagnosis not present

## 2018-08-16 DIAGNOSIS — L720 Epidermal cyst: Secondary | ICD-10-CM | POA: Diagnosis not present

## 2018-08-16 DIAGNOSIS — L820 Inflamed seborrheic keratosis: Secondary | ICD-10-CM | POA: Diagnosis not present

## 2018-08-16 DIAGNOSIS — F4320 Adjustment disorder, unspecified: Secondary | ICD-10-CM | POA: Diagnosis not present

## 2018-08-18 DIAGNOSIS — F320 Major depressive disorder, single episode, mild: Secondary | ICD-10-CM | POA: Diagnosis not present

## 2018-08-25 DIAGNOSIS — F320 Major depressive disorder, single episode, mild: Secondary | ICD-10-CM | POA: Diagnosis not present

## 2018-09-01 DIAGNOSIS — F320 Major depressive disorder, single episode, mild: Secondary | ICD-10-CM | POA: Diagnosis not present

## 2018-09-02 DIAGNOSIS — F4320 Adjustment disorder, unspecified: Secondary | ICD-10-CM | POA: Diagnosis not present

## 2018-09-08 DIAGNOSIS — F320 Major depressive disorder, single episode, mild: Secondary | ICD-10-CM | POA: Diagnosis not present

## 2018-09-15 DIAGNOSIS — F320 Major depressive disorder, single episode, mild: Secondary | ICD-10-CM | POA: Diagnosis not present

## 2018-09-20 DIAGNOSIS — F4320 Adjustment disorder, unspecified: Secondary | ICD-10-CM | POA: Diagnosis not present

## 2018-09-28 DIAGNOSIS — F4320 Adjustment disorder, unspecified: Secondary | ICD-10-CM | POA: Diagnosis not present

## 2018-09-29 DIAGNOSIS — F320 Major depressive disorder, single episode, mild: Secondary | ICD-10-CM | POA: Diagnosis not present

## 2018-10-05 DIAGNOSIS — F320 Major depressive disorder, single episode, mild: Secondary | ICD-10-CM | POA: Diagnosis not present

## 2018-10-11 DIAGNOSIS — F320 Major depressive disorder, single episode, mild: Secondary | ICD-10-CM | POA: Diagnosis not present

## 2018-10-15 DIAGNOSIS — F4320 Adjustment disorder, unspecified: Secondary | ICD-10-CM | POA: Diagnosis not present

## 2018-10-20 DIAGNOSIS — F320 Major depressive disorder, single episode, mild: Secondary | ICD-10-CM | POA: Diagnosis not present

## 2018-10-26 DIAGNOSIS — L720 Epidermal cyst: Secondary | ICD-10-CM | POA: Diagnosis not present

## 2018-10-27 DIAGNOSIS — F320 Major depressive disorder, single episode, mild: Secondary | ICD-10-CM | POA: Diagnosis not present

## 2018-11-02 DIAGNOSIS — Z872 Personal history of diseases of the skin and subcutaneous tissue: Secondary | ICD-10-CM | POA: Diagnosis not present

## 2018-11-02 DIAGNOSIS — Z4802 Encounter for removal of sutures: Secondary | ICD-10-CM | POA: Diagnosis not present

## 2018-11-03 DIAGNOSIS — F320 Major depressive disorder, single episode, mild: Secondary | ICD-10-CM | POA: Diagnosis not present

## 2018-11-10 DIAGNOSIS — F320 Major depressive disorder, single episode, mild: Secondary | ICD-10-CM | POA: Diagnosis not present

## 2018-11-17 DIAGNOSIS — F320 Major depressive disorder, single episode, mild: Secondary | ICD-10-CM | POA: Diagnosis not present

## 2018-12-01 DIAGNOSIS — F320 Major depressive disorder, single episode, mild: Secondary | ICD-10-CM | POA: Diagnosis not present

## 2018-12-07 ENCOUNTER — Other Ambulatory Visit: Payer: Self-pay

## 2018-12-07 DIAGNOSIS — R6889 Other general symptoms and signs: Secondary | ICD-10-CM | POA: Diagnosis not present

## 2018-12-07 DIAGNOSIS — Z20822 Contact with and (suspected) exposure to covid-19: Secondary | ICD-10-CM

## 2018-12-08 DIAGNOSIS — F320 Major depressive disorder, single episode, mild: Secondary | ICD-10-CM | POA: Diagnosis not present

## 2018-12-09 ENCOUNTER — Ambulatory Visit (INDEPENDENT_AMBULATORY_CARE_PROVIDER_SITE_OTHER): Payer: BC Managed Care – PPO

## 2018-12-09 DIAGNOSIS — Z23 Encounter for immunization: Secondary | ICD-10-CM

## 2018-12-09 LAB — NOVEL CORONAVIRUS, NAA: SARS-CoV-2, NAA: NOT DETECTED

## 2018-12-15 DIAGNOSIS — F320 Major depressive disorder, single episode, mild: Secondary | ICD-10-CM | POA: Diagnosis not present

## 2018-12-22 DIAGNOSIS — F320 Major depressive disorder, single episode, mild: Secondary | ICD-10-CM | POA: Diagnosis not present

## 2018-12-29 DIAGNOSIS — F320 Major depressive disorder, single episode, mild: Secondary | ICD-10-CM | POA: Diagnosis not present

## 2018-12-31 ENCOUNTER — Other Ambulatory Visit: Payer: Self-pay

## 2018-12-31 DIAGNOSIS — Z20828 Contact with and (suspected) exposure to other viral communicable diseases: Secondary | ICD-10-CM | POA: Diagnosis not present

## 2018-12-31 DIAGNOSIS — Z20822 Contact with and (suspected) exposure to covid-19: Secondary | ICD-10-CM

## 2019-01-02 LAB — NOVEL CORONAVIRUS, NAA: SARS-CoV-2, NAA: NOT DETECTED

## 2019-01-03 ENCOUNTER — Other Ambulatory Visit: Payer: Self-pay | Admitting: Primary Care

## 2019-01-03 DIAGNOSIS — E785 Hyperlipidemia, unspecified: Secondary | ICD-10-CM

## 2019-01-03 DIAGNOSIS — Z0000 Encounter for general adult medical examination without abnormal findings: Secondary | ICD-10-CM

## 2019-01-03 DIAGNOSIS — Z8042 Family history of malignant neoplasm of prostate: Secondary | ICD-10-CM

## 2019-01-06 DIAGNOSIS — F320 Major depressive disorder, single episode, mild: Secondary | ICD-10-CM | POA: Diagnosis not present

## 2019-01-11 ENCOUNTER — Other Ambulatory Visit: Payer: Self-pay

## 2019-01-11 ENCOUNTER — Other Ambulatory Visit (INDEPENDENT_AMBULATORY_CARE_PROVIDER_SITE_OTHER): Payer: BC Managed Care – PPO

## 2019-01-11 DIAGNOSIS — Z8042 Family history of malignant neoplasm of prostate: Secondary | ICD-10-CM

## 2019-01-11 DIAGNOSIS — E785 Hyperlipidemia, unspecified: Secondary | ICD-10-CM

## 2019-01-11 LAB — COMPREHENSIVE METABOLIC PANEL
ALT: 23 U/L (ref 0–53)
AST: 19 U/L (ref 0–37)
Albumin: 4.6 g/dL (ref 3.5–5.2)
Alkaline Phosphatase: 57 U/L (ref 39–117)
BUN: 14 mg/dL (ref 6–23)
CO2: 32 mEq/L (ref 19–32)
Calcium: 9.4 mg/dL (ref 8.4–10.5)
Chloride: 105 mEq/L (ref 96–112)
Creatinine, Ser: 1.18 mg/dL (ref 0.40–1.50)
GFR: 65.4 mL/min (ref >60.00)
Glucose, Bld: 108 mg/dL — ABNORMAL HIGH (ref 70–99)
Potassium: 4 mEq/L (ref 3.5–5.1)
Sodium: 141 mEq/L (ref 135–145)
Total Bilirubin: 0.9 mg/dL (ref 0.2–1.2)
Total Protein: 6.3 g/dL (ref 6.0–8.3)

## 2019-01-11 LAB — LIPID PANEL
Cholesterol: 176 mg/dL (ref 0–200)
HDL: 33.2 mg/dL — ABNORMAL LOW (ref >39.00)
LDL Cholesterol: 113 mg/dL — ABNORMAL HIGH (ref 0–99)
NonHDL: 143.28
Total CHOL/HDL Ratio: 5
Triglycerides: 151 mg/dL — ABNORMAL HIGH (ref 0.0–149.0)
VLDL: 30.2 mg/dL (ref 0.0–40.0)

## 2019-01-11 LAB — CBC
HCT: 43 % (ref 39.0–52.0)
Hemoglobin: 15 g/dL (ref 13.0–17.0)
MCHC: 34.8 g/dL (ref 30.0–36.0)
MCV: 87.3 fl (ref 78.0–100.0)
Platelets: 215 10*3/uL (ref 150.0–400.0)
RBC: 4.93 Mil/uL (ref 4.22–5.81)
RDW: 13.7 % (ref 11.5–15.5)
WBC: 7 10*3/uL (ref 4.0–10.5)

## 2019-01-12 DIAGNOSIS — F320 Major depressive disorder, single episode, mild: Secondary | ICD-10-CM | POA: Diagnosis not present

## 2019-01-13 LAB — PSA: PSA: 0.83 ng/mL (ref 0.10–4.00)

## 2019-01-18 ENCOUNTER — Other Ambulatory Visit: Payer: Self-pay

## 2019-01-18 ENCOUNTER — Encounter: Payer: Self-pay | Admitting: Primary Care

## 2019-01-18 ENCOUNTER — Ambulatory Visit (INDEPENDENT_AMBULATORY_CARE_PROVIDER_SITE_OTHER): Payer: BC Managed Care – PPO | Admitting: Primary Care

## 2019-01-18 VITALS — BP 122/76 | HR 68 | Temp 97.9°F | Ht 71.0 in | Wt 273.5 lb

## 2019-01-18 DIAGNOSIS — G4730 Sleep apnea, unspecified: Secondary | ICD-10-CM | POA: Diagnosis not present

## 2019-01-18 DIAGNOSIS — R739 Hyperglycemia, unspecified: Secondary | ICD-10-CM

## 2019-01-18 DIAGNOSIS — Z1211 Encounter for screening for malignant neoplasm of colon: Secondary | ICD-10-CM | POA: Diagnosis not present

## 2019-01-18 DIAGNOSIS — E785 Hyperlipidemia, unspecified: Secondary | ICD-10-CM

## 2019-01-18 DIAGNOSIS — Z0000 Encounter for general adult medical examination without abnormal findings: Secondary | ICD-10-CM | POA: Diagnosis not present

## 2019-01-18 DIAGNOSIS — Z8042 Family history of malignant neoplasm of prostate: Secondary | ICD-10-CM

## 2019-01-18 DIAGNOSIS — Z3009 Encounter for other general counseling and advice on contraception: Secondary | ICD-10-CM | POA: Diagnosis not present

## 2019-01-18 HISTORY — DX: Family history of malignant neoplasm of prostate: Z80.42

## 2019-01-18 LAB — POCT GLYCOSYLATED HEMOGLOBIN (HGB A1C): Hemoglobin A1C: 5.2 % (ref 4.0–5.6)

## 2019-01-18 NOTE — Patient Instructions (Signed)
Start exercising. You should be getting 150 minutes of moderate intensity exercise weekly.  It's important to improve your diet by reducing consumption of fast food, fried food, processed snack foods, sugary drinks. Increase consumption of fresh vegetables and fruits, whole grains, water.  Ensure you are drinking 64 ounces of water daily.  You will be contacted regarding your referral to GI for the colonoscopy and Urology for vasectomy.  Please let us know if you have not been contacted within two weeks.   Stop by the lab prior to leaving today. I will notify you of your results once received.   It was a pleasure to see you today!   Preventive Care 36-88 Years Old, Male Preventive care refers to lifestyle choices and visits with your health care provider that can promote health and wellness. This includes:  A yearly physical exam. This is also called an annual well check.  Regular dental and eye exams.  Immunizations.  Screening for certain conditions.  Healthy lifestyle choices, such as eating a healthy diet, getting regular exercise, not using drugs or products that contain nicotine and tobacco, and limiting alcohol use. What can I expect for my preventive care visit? Physical exam Your health care provider will check:  Height and weight. These may be used to calculate body mass index (BMI), which is a measurement that tells if you are at a healthy weight.  Heart rate and blood pressure.  Your skin for abnormal spots. Counseling Your health care provider may ask you questions about:  Alcohol, tobacco, and drug use.  Emotional well-being.  Home and relationship well-being.  Sexual activity.  Eating habits.  Work and work Statistician. What immunizations do I need?  Influenza (flu) vaccine  This is recommended every year. Tetanus, diphtheria, and pertussis (Tdap) vaccine  You may need a Td booster every 10 years. Varicella (chickenpox) vaccine  You may need  this vaccine if you have not already been vaccinated. Zoster (shingles) vaccine  You may need this after age 24. Measles, mumps, and rubella (MMR) vaccine  You may need at least one dose of MMR if you were born in 1957 or later. You may also need a second dose. Pneumococcal conjugate (PCV13) vaccine  You may need this if you have certain conditions and were not previously vaccinated. Pneumococcal polysaccharide (PPSV23) vaccine  You may need one or two doses if you smoke cigarettes or if you have certain conditions. Meningococcal conjugate (MenACWY) vaccine  You may need this if you have certain conditions. Hepatitis A vaccine  You may need this if you have certain conditions or if you travel or work in places where you may be exposed to hepatitis A. Hepatitis B vaccine  You may need this if you have certain conditions or if you travel or work in places where you may be exposed to hepatitis B. Haemophilus influenzae type b (Hib) vaccine  You may need this if you have certain risk factors. Human papillomavirus (HPV) vaccine  If recommended by your health care provider, you may need three doses over 6 months. You may receive vaccines as individual doses or as more than one vaccine together in one shot (combination vaccines). Talk with your health care provider about the risks and benefits of combination vaccines. What tests do I need? Blood tests  Lipid and cholesterol levels. These may be checked every 5 years, or more frequently if you are over 62 years old.  Hepatitis C test.  Hepatitis B test. Screening  Lung cancer  screening. You may have this screening every year starting at age 67 if you have a 30-pack-year history of smoking and currently smoke or have quit within the past 15 years.  Prostate cancer screening. Recommendations will vary depending on your family history and other risks.  Colorectal cancer screening. All adults should have this screening starting at age  22 and continuing until age 61. Your health care provider may recommend screening at age 62 if you are at increased risk. You will have tests every 1-10 years, depending on your results and the type of screening test.  Diabetes screening. This is done by checking your blood sugar (glucose) after you have not eaten for a while (fasting). You may have this done every 1-3 years.  Sexually transmitted disease (STD) testing. Follow these instructions at home: Eating and drinking  Eat a diet that includes fresh fruits and vegetables, whole grains, lean protein, and low-fat dairy products.  Take vitamin and mineral supplements as recommended by your health care provider.  Do not drink alcohol if your health care provider tells you not to drink.  If you drink alcohol: ? Limit how much you have to 0-2 drinks a day. ? Be aware of how much alcohol is in your drink. In the [place] one drink equals one 12 oz bottle of beer (355 mL), one 5 oz glass of wine (148 mL), or one 1 oz glass of hard liquor (44 mL). Lifestyle  Take daily care of your teeth and gums.  Stay active. Exercise for at least 30 minutes on 5 or more days each week.  Do not use any products that contain nicotine or tobacco, such as cigarettes, e-cigarettes, and chewing tobacco. If you need help quitting, ask your health care provider.  If you are sexually active, practice safe sex. Use a condom or other form of protection to prevent STIs (sexually transmitted infections).  Talk with your health care provider about taking a low-dose aspirin every day starting at age 36. What's next?  Go to your health care provider once a year for a well check visit.  Ask your health care provider how often you should have your eyes and teeth checked.  Stay up to date on all vaccines. This information is not intended to replace advice given to you by your health care provider. Make sure you discuss any questions you have with your health care  provider. Document Released: 03/30/2015 Document Revised: 02/25/2018 Document Reviewed: 02/25/2018 Elsevier Patient Education  2020 [employer].

## 2019-01-18 NOTE — Assessment & Plan Note (Signed)
Diagnosed around 2017, very mild sleep apnea, had soft palate surgery. No CPAP use.

## 2019-01-18 NOTE — Progress Notes (Signed)
Subjective:    Patient ID: Richard [name], male    DOB: 04-18-1970, 48 y.o.   MRN: [id number]  HPI  Richard [name] is a 48 year old male who presents today for complete physical. He would also like to be referred for vasectomy as he doesn't desire any more children.   Immunizations: -Tetanus: Completed in 2015 -Influenza: Completed this season   Diet: He endorses a fair diet. He is eating mostly home cooked meals, some take out food. He is drinking coffee, water, soda. Desserts on occasion.  Exercise: He is not exercising.  Eye exam: Completed years ago. Dental exam: Completes semi-annually  Colonoscopy: Never completed  BP Readings from Last 3 Encounters:  01/18/19 122/76  05/12/18 120/72  01/15/18 120/76      Review of Systems  Constitutional: Negative for unexpected weight change.  HENT: Negative for rhinorrhea.   Respiratory: Negative for cough and shortness of breath.   Cardiovascular: Negative for chest pain.  Gastrointestinal: Negative for constipation and diarrhea.  Genitourinary: Negative for difficulty urinating.  Musculoskeletal: Negative for arthralgias and myalgias.  Skin: Negative for rash.  Allergic/Immunologic: Negative for environmental allergies.  Neurological: Negative for dizziness, numbness and headaches.  Psychiatric/Behavioral: The patient is not nervous/anxious.        Past Medical History:  Diagnosis Date  . Diverticulosis   . Medical history non-contributory      Social History   Socioeconomic History  . Marital status: Divorced    Spouse name: Not on file  . Number of children: Not on file  . Years of education: Not on file  . Highest education level: Not on file  Occupational History  . Not on file  Social Needs  . Financial resource strain: Not on file  . Food insecurity    Worry: Not on file    Inability: Not on file  . Transportation needs    Medical: Not on file    Non-medical: Not on file  Tobacco Use  . Smoking status:  Never Smoker  . Smokeless tobacco: Never Used  Substance and Sexual Activity  . Alcohol use: No    Alcohol/week: 0.0 standard drinks  . Drug use: No  . Sexual activity: Yes    Partners: Female  Lifestyle  . Physical activity    Days per week: Not on file    Minutes per session: Not on file  . Stress: Not on file  Relationships  . Social Herbalist on phone: Not on file    Gets together: Not on file    Attends religious service: Not on file    Active member of club or organization: Not on file    Attends meetings of clubs or organizations: Not on file    Relationship status: Not on file  . Intimate partner violence    Fear of current or ex partner: Not on file    Emotionally abused: Not on file    Physically abused: Not on file    Forced sexual activity: Not on file  Other Topics Concern  . Not on file  Social History Narrative  . Not on file    Past Surgical History:  Procedure Laterality Date  . 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 CHOLECYSTECTOMY    . COSMETIC SURGERY Left    lipoma removal, arm/ forehead  . NASAL SEPTOPLASTY W/ TURBINOPLASTY Bilateral 10/10/2015   Procedure: NASAL SEPTOPLASTY WITH BILATERAL TURBINATE REDUCTION;  Surgeon: [name], MD;  Location: [facility] OR;  Service: ENT;  Laterality: Bilateral;  . UVULECTOMY N/A 10/10/2015   Procedure: Myrtis Ser ASSISTED;  Surgeon: [name], MD;  Location: [facility] OR;  Service: ENT;  Laterality: N/A;    Family History  Problem Relation Age of Onset  . Diabetes Mother   . Cancer Mother 55       breast, skin,lymphoma, deceased  . Cancer Father        prostate, skin  . Depression Sister   . Cancer Sister        sarcoidosis  . Depression Brother     Allergies  Allergen Reactions  . Ppd [Tuberculin Purified Protein Derivative] Other (See Comments)    + PPD NEG CXR 2/09    Current Outpatient Medications on File Prior to Visit  Medication Sig Dispense Refill  . FIBER ADULT GUMMIES PO Take  by mouth.    . Multiple Vitamin (MULTIVITAMIN) capsule Take 1 capsule by mouth daily.     No current facility-administered medications on file prior to visit.     BP 122/76   Pulse 68   Temp 97.9 F (36.6 C) (Temporal)   Ht 5\' 11"  (1.803 m)   Wt 273 lb 8 oz (124.1 kg)   SpO2 97%   BMI 38.15 kg/m    Objective:   Physical Exam  Constitutional: He is oriented to person, place, and time. He appears well-nourished.  HENT:  Right Ear: Tympanic membrane and ear canal normal.  Left Ear: Tympanic membrane and ear canal normal.  Mouth/Throat: Oropharynx is clear and moist.  Eyes: Pupils are equal, round, and reactive to light. EOM are normal.  Neck: Neck supple.  Cardiovascular: Normal rate and regular rhythm.  Respiratory: Effort normal and breath sounds normal.  GI: Soft. Bowel sounds are normal. There is no abdominal tenderness.  Musculoskeletal: Normal range of motion.  Neurological: He is alert and oriented to person, place, and time.  Skin: Skin is warm and dry.  Psychiatric: He has a normal mood and affect.           Assessment & Plan:

## 2019-01-18 NOTE — Assessment & Plan Note (Signed)
PSA within normal range, no significant change from last year. Continue to monitor.

## 2019-01-18 NOTE — Assessment & Plan Note (Signed)
Borderline high, encouraged weight loss through diet and exercise. Continue to monitor.

## 2019-01-18 NOTE — Assessment & Plan Note (Signed)
Referral placed to Urology for evaluation.

## 2019-01-18 NOTE — Assessment & Plan Note (Signed)
Immunizations UTD. Colon cancer screening due, referral placed to GI. PSA unremarkable, family history of prostate cancer in father. Discussed the importance of a healthy diet and regular exercise in order for weight loss, and to reduce the risk of any potential medical problems. Exam unremarkable. Labs reviewed.

## 2019-01-19 DIAGNOSIS — F320 Major depressive disorder, single episode, mild: Secondary | ICD-10-CM | POA: Diagnosis not present

## 2019-01-26 DIAGNOSIS — F320 Major depressive disorder, single episode, mild: Secondary | ICD-10-CM | POA: Diagnosis not present

## 2019-01-31 ENCOUNTER — Other Ambulatory Visit: Payer: Self-pay

## 2019-01-31 DIAGNOSIS — Z20822 Contact with and (suspected) exposure to covid-19: Secondary | ICD-10-CM

## 2019-02-02 DIAGNOSIS — F320 Major depressive disorder, single episode, mild: Secondary | ICD-10-CM | POA: Diagnosis not present

## 2019-02-02 LAB — NOVEL CORONAVIRUS, NAA: SARS-CoV-2, NAA: NOT DETECTED

## 2019-02-09 DIAGNOSIS — F320 Major depressive disorder, single episode, mild: Secondary | ICD-10-CM | POA: Diagnosis not present

## 2019-02-16 ENCOUNTER — Other Ambulatory Visit: Payer: Self-pay

## 2019-02-16 DIAGNOSIS — F320 Major depressive disorder, single episode, mild: Secondary | ICD-10-CM | POA: Diagnosis not present

## 2019-02-16 DIAGNOSIS — Z20822 Contact with and (suspected) exposure to covid-19: Secondary | ICD-10-CM

## 2019-02-19 LAB — NOVEL CORONAVIRUS, NAA: SARS-CoV-2, NAA: NOT DETECTED

## 2019-02-21 ENCOUNTER — Encounter: Payer: Self-pay | Admitting: Gastroenterology

## 2019-02-22 DIAGNOSIS — Z3009 Encounter for other general counseling and advice on contraception: Secondary | ICD-10-CM | POA: Diagnosis not present

## 2019-02-23 DIAGNOSIS — F320 Major depressive disorder, single episode, mild: Secondary | ICD-10-CM | POA: Diagnosis not present

## 2019-03-02 DIAGNOSIS — F320 Major depressive disorder, single episode, mild: Secondary | ICD-10-CM | POA: Diagnosis not present

## 2019-03-09 DIAGNOSIS — F320 Major depressive disorder, single episode, mild: Secondary | ICD-10-CM | POA: Diagnosis not present

## 2019-03-23 DIAGNOSIS — F320 Major depressive disorder, single episode, mild: Secondary | ICD-10-CM | POA: Diagnosis not present

## 2019-03-28 ENCOUNTER — Other Ambulatory Visit: Payer: Self-pay

## 2019-03-28 ENCOUNTER — Ambulatory Visit (AMBULATORY_SURGERY_CENTER): Payer: Self-pay | Admitting: *Deleted

## 2019-03-28 VITALS — Temp 97.4°F | Ht 71.0 in | Wt 279.0 lb

## 2019-03-28 DIAGNOSIS — Z01818 Encounter for other preprocedural examination: Secondary | ICD-10-CM

## 2019-03-28 DIAGNOSIS — Z1211 Encounter for screening for malignant neoplasm of colon: Secondary | ICD-10-CM

## 2019-03-28 MED ORDER — NA SULFATE-K SULFATE-MG SULF 17.5-3.13-1.6 GM/177ML PO SOLN: ORAL | 0 refills | Status: DC

## 2019-03-28 NOTE — Progress Notes (Signed)
Patient is here in-person for PV. Patient denies any allergies to eggs or soy. Patient denies any problems with anesthesia/sedation. Patient denies any oxygen use at home. Patient denies taking any diet/weight loss medications or blood thinners. Patient is not being treated for MRSA or C-diff. EMMI education assisgned to the patient for the procedure, this was explained and instructions given to patient. COVID-19 screening test is on 2/2, the pt is aware. Pt is aware that care partner will wait in the car during procedure; if they feel like they will be too hot or cold to wait in the car; they may wait in the 4 th floor lobby. Patient is aware to bring only one care partner. We want them to wear a mask (we do not have any that we can provide them), practice social distancing, and we will check their temperatures when they get here.  I did remind the patient that their care partner needs to stay in the parking lot the entire time and have a cell phone available, we will call them when the pt is ready for discharge. Patient will wear mask into building.    Suprep $15 off coupon given to the patient.

## 2019-04-11 ENCOUNTER — Encounter: Payer: BC Managed Care – PPO | Admitting: Gastroenterology

## 2019-04-12 DIAGNOSIS — F320 Major depressive disorder, single episode, mild: Secondary | ICD-10-CM | POA: Diagnosis not present

## 2019-04-14 ENCOUNTER — Encounter: Payer: Self-pay | Admitting: Gastroenterology

## 2019-04-19 ENCOUNTER — Other Ambulatory Visit: Payer: Self-pay | Admitting: Gastroenterology

## 2019-04-19 ENCOUNTER — Ambulatory Visit (INDEPENDENT_AMBULATORY_CARE_PROVIDER_SITE_OTHER): Payer: Self-pay

## 2019-04-19 ENCOUNTER — Other Ambulatory Visit: Payer: Self-pay

## 2019-04-19 DIAGNOSIS — Z1159 Encounter for screening for other viral diseases: Secondary | ICD-10-CM

## 2019-04-20 DIAGNOSIS — F320 Major depressive disorder, single episode, mild: Secondary | ICD-10-CM | POA: Diagnosis not present

## 2019-04-20 LAB — SARS CORONAVIRUS 2 (TAT 6-24 HRS): SARS Coronavirus 2: NEGATIVE

## 2019-04-21 ENCOUNTER — Other Ambulatory Visit: Payer: Self-pay

## 2019-04-21 ENCOUNTER — Encounter: Payer: Self-pay | Admitting: Gastroenterology

## 2019-04-21 ENCOUNTER — Ambulatory Visit (AMBULATORY_SURGERY_CENTER): Payer: BC Managed Care – PPO | Admitting: Gastroenterology

## 2019-04-21 VITALS — BP 116/72 | HR 64 | Temp 96.9°F | Resp 19 | Ht 71.0 in | Wt 279.0 lb

## 2019-04-21 DIAGNOSIS — Z1211 Encounter for screening for malignant neoplasm of colon: Secondary | ICD-10-CM | POA: Diagnosis not present

## 2019-04-21 DIAGNOSIS — D122 Benign neoplasm of ascending colon: Secondary | ICD-10-CM | POA: Diagnosis not present

## 2019-04-21 DIAGNOSIS — D125 Benign neoplasm of sigmoid colon: Secondary | ICD-10-CM

## 2019-04-21 MED ORDER — SODIUM CHLORIDE 0.9 % IV SOLN: 500.0000 mL | Freq: Once | INTRAVENOUS | Status: DC

## 2019-04-21 NOTE — Op Note (Signed)
[facility] [place] Patient Name: Richard [name] Procedure Date: 04/21/2019 8:53 AM MRN: [id number] Endoscopist: Richard [name] , MD Age: 49 Referring MD:  Date of Birth: 1970/12/10 Gender: Male Account #: [id number] Procedure:                Colonoscopy Indications:              Screening for colorectal malignant neoplasm, This                            is the patient's first colonoscopy Medicines:                Monitored Anesthesia Care Procedure:                Pre-Anesthesia Assessment:                           - Prior to the procedure, a History and Physical                            was performed, and patient medications and                            allergies were reviewed. The patient's tolerance of                            previous anesthesia was also reviewed. The risks                            and benefits of the procedure and the sedation                            options and risks were discussed with the patient.                            All questions were answered, and informed consent                            was obtained. Prior Anticoagulants: The patient has                            taken no previous anticoagulant or antiplatelet                            agents. ASA Grade Assessment: II - A patient with                            mild systemic disease. After reviewing the risks                            and benefits, the patient was deemed in                            satisfactory condition to undergo the procedure.  After obtaining informed consent, the colonoscope                            was passed under direct vision. Throughout the                            procedure, the patient's blood pressure, pulse, and                            oxygen saturations were monitored continuously. The                            Colonoscope was introduced through the anus and                            advanced to the the cecum,  identified by                            appendiceal orifice and ileocecal valve. The                            colonoscopy was performed without difficulty. The                            patient tolerated the procedure well. The quality                            of the bowel preparation was good. The ileocecal                            valve, appendiceal orifice, and rectum were                            photographed. Scope In: 8:59:10 AM Scope Out: 9:20:52 AM Scope Withdrawal Time: 0 hours 17 minutes 49 seconds  Total Procedure Duration: 0 hours 21 minutes 42 seconds  Findings:                 The perianal and digital rectal examinations were                            normal.                           Two (one flat and one sessile) polyps were found in                            the ascending colon. The polyps were 3 mm in size.                            These polyps were removed with a cold snare.                            Resection and retrieval were complete.  Two sessile polyps were found in the sigmoid colon.                            The polyps were 4 to 5 mm in size. These polyps                            were removed with a cold snare. Resection and                            retrieval were complete.                           Internal hemorrhoids were found during                            retroflexion. The hemorrhoids were small.                           The exam was otherwise without abnormality. Complications:            No immediate complications. Estimated blood loss:                            Minimal. Estimated Blood Loss:     Estimated blood loss was minimal. Impression:               - Two 3 mm polyps in the ascending colon, removed                            with a cold snare. Resected and retrieved.                           - Two 4 to 5 mm polyps in the sigmoid colon,                            removed with a cold snare.  Resected and retrieved.                           - Internal hemorrhoids.                           - The examination was otherwise normal. Recommendation:           - Patient has a contact number available for                            emergencies. The signs and symptoms of potential                            delayed complications were discussed with the                            patient. Return to normal activities tomorrow.  Written discharge instructions were provided to the                            patient.                           - Resume previous diet.                           - Continue present medications. Richard Lipps P. Makaiya Geerdes, MD 04/21/2019 9:24:55 AM This report has been signed electronically.

## 2019-04-21 NOTE — Progress Notes (Signed)
PT taken to PACU. Monitors in place. VSS. Report given to RN. 

## 2019-04-21 NOTE — Progress Notes (Signed)
Temp-LC VS-CW  Pt's states no medical or surgical changes since previsit or office visit.  

## 2019-04-21 NOTE — Patient Instructions (Signed)
Please, read all of the handouts given to you by your recovery room nurse.  Thank-you for choosing [place] today for your healthcare needs.  YOU HAD AN ENDOSCOPIC PROCEDURE TODAY AT [facility]:   Refer to the procedure report that was given to you for any specific questions about what was found during the examination.  If the procedure report does not answer your questions, please call your gastroenterologist to clarify.  If you requested that your care partner not be given the details of your procedure findings, then the procedure report has been included in a sealed envelope for you to review at your convenience later.  YOU SHOULD EXPECT: Some feelings of bloating in the abdomen. Passage of more gas than usual.  Walking can help get rid of the air that was put into your GI tract during the procedure and reduce the bloating. If you had a lower endoscopy (such as a colonoscopy or flexible sigmoidoscopy) you may notice spotting of blood in your stool or on the toilet paper. If you underwent a bowel prep for your procedure, you may not have a normal bowel movement for a few days.  Please Note:  You might notice some irritation and congestion in your nose or some drainage.  This is from the oxygen used during your procedure.  There is no need for concern and it should clear up in a day or so.  SYMPTOMS TO REPORT IMMEDIATELY:   Following lower endoscopy (colonoscopy or flexible sigmoidoscopy):  Excessive amounts of blood in the stool  Significant tenderness or worsening of abdominal pains  Swelling of the abdomen that is new, acute  Fever of 100F or higher   Black, tarry-looking stools  For urgent or emergent issues, a gastroenterologist can be reached at any hour by calling [phone].   DIET:  We do recommend a small meal at first, but then you may proceed to your regular diet.  Drink plenty of fluids but you should avoid alcoholic beverages for 24 hours.  ACTIVITY:  You  should plan to take it easy for the rest of today and you should NOT DRIVE or use heavy machinery until tomorrow (because of the sedation medicines used during the test).    FOLLOW UP: Our staff will call the number listed on your records 48-72 hours following your procedure to check on you and address any questions or concerns that you may have regarding the information given to you following your procedure. If we do not reach you, we will leave a message.  We will attempt to reach you two times.  During this call, we will ask if you have developed any symptoms of COVID 19. If you develop any symptoms (ie: fever, flu-like symptoms, shortness of breath, cough etc.) before then, please call [phone].  If you test positive for Covid 19 in the 2 weeks post procedure, please call and report this information to [place].    If any biopsies were taken you will be contacted by phone or by letter within the next 1-3 weeks.  Please call us at [phone] if you have not heard about the biopsies in 3 weeks.    SIGNATURES/CONFIDENTIALITY: You and/or your care partner have signed paperwork which will be entered into your electronic medical record.  These signatures attest to the fact that that the information above on your After Visit Summary has been reviewed and is understood.  Full responsibility of the confidentiality of this discharge information lies with you and/or  your care-partner. 

## 2019-04-25 ENCOUNTER — Telehealth: Payer: Self-pay

## 2019-04-25 NOTE — Telephone Encounter (Signed)
  Follow up Call-  Call back number 04/21/2019  Post procedure Call Back phone  # [phone]  Permission to leave phone message Yes  Some recent data might be hidden     Left message

## 2019-04-25 NOTE — Telephone Encounter (Signed)
Attempted to reach patient for post-procedure f/u call. No answer. Left message for him to please not hesitate to call us if he has any questions/concerns regarding his care. 

## 2019-04-27 DIAGNOSIS — F320 Major depressive disorder, single episode, mild: Secondary | ICD-10-CM | POA: Diagnosis not present

## 2019-05-04 DIAGNOSIS — F320 Major depressive disorder, single episode, mild: Secondary | ICD-10-CM | POA: Diagnosis not present

## 2019-05-11 DIAGNOSIS — F320 Major depressive disorder, single episode, mild: Secondary | ICD-10-CM | POA: Diagnosis not present

## 2019-05-18 DIAGNOSIS — F320 Major depressive disorder, single episode, mild: Secondary | ICD-10-CM | POA: Diagnosis not present

## 2019-05-23 DIAGNOSIS — Z03818 Encounter for observation for suspected exposure to other biological agents ruled out: Secondary | ICD-10-CM | POA: Diagnosis not present

## 2019-05-23 DIAGNOSIS — Z20828 Contact with and (suspected) exposure to other viral communicable diseases: Secondary | ICD-10-CM | POA: Diagnosis not present

## 2019-05-25 DIAGNOSIS — F320 Major depressive disorder, single episode, mild: Secondary | ICD-10-CM | POA: Diagnosis not present

## 2019-06-01 DIAGNOSIS — F320 Major depressive disorder, single episode, mild: Secondary | ICD-10-CM | POA: Diagnosis not present

## 2019-06-08 DIAGNOSIS — F320 Major depressive disorder, single episode, mild: Secondary | ICD-10-CM | POA: Diagnosis not present

## 2019-06-15 DIAGNOSIS — F320 Major depressive disorder, single episode, mild: Secondary | ICD-10-CM | POA: Diagnosis not present

## 2019-06-22 DIAGNOSIS — F320 Major depressive disorder, single episode, mild: Secondary | ICD-10-CM | POA: Diagnosis not present

## 2019-06-29 DIAGNOSIS — F320 Major depressive disorder, single episode, mild: Secondary | ICD-10-CM | POA: Diagnosis not present

## 2019-07-06 DIAGNOSIS — F320 Major depressive disorder, single episode, mild: Secondary | ICD-10-CM | POA: Diagnosis not present

## 2019-07-13 DIAGNOSIS — F320 Major depressive disorder, single episode, mild: Secondary | ICD-10-CM | POA: Diagnosis not present

## 2019-07-20 DIAGNOSIS — F320 Major depressive disorder, single episode, mild: Secondary | ICD-10-CM | POA: Diagnosis not present

## 2019-07-27 DIAGNOSIS — Z03818 Encounter for observation for suspected exposure to other biological agents ruled out: Secondary | ICD-10-CM | POA: Diagnosis not present

## 2019-07-27 DIAGNOSIS — F320 Major depressive disorder, single episode, mild: Secondary | ICD-10-CM | POA: Diagnosis not present

## 2019-07-27 DIAGNOSIS — Z20828 Contact with and (suspected) exposure to other viral communicable diseases: Secondary | ICD-10-CM | POA: Diagnosis not present

## 2019-07-28 DIAGNOSIS — Z20828 Contact with and (suspected) exposure to other viral communicable diseases: Secondary | ICD-10-CM | POA: Diagnosis not present

## 2019-07-28 DIAGNOSIS — Z03818 Encounter for observation for suspected exposure to other biological agents ruled out: Secondary | ICD-10-CM | POA: Diagnosis not present

## 2019-08-17 DIAGNOSIS — F320 Major depressive disorder, single episode, mild: Secondary | ICD-10-CM | POA: Diagnosis not present

## 2019-08-24 DIAGNOSIS — F320 Major depressive disorder, single episode, mild: Secondary | ICD-10-CM | POA: Diagnosis not present

## 2019-08-31 DIAGNOSIS — F320 Major depressive disorder, single episode, mild: Secondary | ICD-10-CM | POA: Diagnosis not present

## 2019-09-14 DIAGNOSIS — F320 Major depressive disorder, single episode, mild: Secondary | ICD-10-CM | POA: Diagnosis not present

## 2019-09-21 DIAGNOSIS — F320 Major depressive disorder, single episode, mild: Secondary | ICD-10-CM | POA: Diagnosis not present

## 2019-09-28 DIAGNOSIS — F320 Major depressive disorder, single episode, mild: Secondary | ICD-10-CM | POA: Diagnosis not present

## 2019-10-26 DIAGNOSIS — F320 Major depressive disorder, single episode, mild: Secondary | ICD-10-CM | POA: Diagnosis not present

## 2019-10-27 ENCOUNTER — Ambulatory Visit (INDEPENDENT_AMBULATORY_CARE_PROVIDER_SITE_OTHER): Payer: Self-pay | Admitting: Family Medicine

## 2019-11-02 DIAGNOSIS — F320 Major depressive disorder, single episode, mild: Secondary | ICD-10-CM | POA: Diagnosis not present

## 2019-11-09 ENCOUNTER — Encounter (INDEPENDENT_AMBULATORY_CARE_PROVIDER_SITE_OTHER): Payer: Self-pay | Admitting: Bariatrics

## 2019-11-09 ENCOUNTER — Ambulatory Visit (INDEPENDENT_AMBULATORY_CARE_PROVIDER_SITE_OTHER): Payer: BC Managed Care – PPO | Admitting: Bariatrics

## 2019-11-09 ENCOUNTER — Other Ambulatory Visit: Payer: Self-pay

## 2019-11-09 VITALS — BP 122/78 | HR 58 | Temp 97.9°F | Ht 72.0 in | Wt 287.0 lb

## 2019-11-09 DIAGNOSIS — G8929 Other chronic pain: Secondary | ICD-10-CM

## 2019-11-09 DIAGNOSIS — Z1331 Encounter for screening for depression: Secondary | ICD-10-CM | POA: Diagnosis not present

## 2019-11-09 DIAGNOSIS — M79673 Pain in unspecified foot: Secondary | ICD-10-CM

## 2019-11-09 DIAGNOSIS — Z9189 Other specified personal risk factors, not elsewhere classified: Secondary | ICD-10-CM | POA: Diagnosis not present

## 2019-11-09 DIAGNOSIS — R7309 Other abnormal glucose: Secondary | ICD-10-CM | POA: Diagnosis not present

## 2019-11-09 DIAGNOSIS — E7849 Other hyperlipidemia: Secondary | ICD-10-CM

## 2019-11-09 DIAGNOSIS — E785 Hyperlipidemia, unspecified: Secondary | ICD-10-CM | POA: Diagnosis not present

## 2019-11-09 DIAGNOSIS — G4733 Obstructive sleep apnea (adult) (pediatric): Secondary | ICD-10-CM

## 2019-11-09 DIAGNOSIS — E1169 Type 2 diabetes mellitus with other specified complication: Secondary | ICD-10-CM | POA: Diagnosis not present

## 2019-11-09 DIAGNOSIS — R5383 Other fatigue: Secondary | ICD-10-CM

## 2019-11-09 DIAGNOSIS — E559 Vitamin D deficiency, unspecified: Secondary | ICD-10-CM | POA: Diagnosis not present

## 2019-11-09 DIAGNOSIS — R0602 Shortness of breath: Secondary | ICD-10-CM | POA: Diagnosis not present

## 2019-11-09 DIAGNOSIS — Z0289 Encounter for other administrative examinations: Secondary | ICD-10-CM

## 2019-11-09 DIAGNOSIS — Z6839 Body mass index (BMI) 39.0-39.9, adult: Secondary | ICD-10-CM

## 2019-11-09 DIAGNOSIS — F320 Major depressive disorder, single episode, mild: Secondary | ICD-10-CM | POA: Diagnosis not present

## 2019-11-09 NOTE — Progress Notes (Signed)
Chief Complaint:   OBESITY Richard [name] (MR# [id number]) is a 49 y.o. male who presents for evaluation and treatment of obesity and related comorbidities. Current BMI is Body mass index is 38.92 kg/m.Marland Kitchen Richard [name] has been struggling with his weight for many years and has been unsuccessful in either losing weight, maintaining weight loss, or reaching his healthy weight goal.  Richard [name] is currently in the action stage of change and ready to dedicate time achieving and maintaining a healthier weight. Richard [name] is interested in becoming our patient and working on intensive lifestyle modifications including (but not limited to) diet and exercise for weight loss.  Richard [name] is lactose intolerant. He likes to cook, but notes limitations as work and time. He craves salty foods. He sometimes skips meals.  Richard [name]'s habits were reviewed today and are as follows: he thinks his family will eat healthier with him, he struggles with family and or coworkers weight loss sabotage, his desired weight loss is 77 lbs, he has been heavy most of his life, he started gaining weight as a teenager, his heaviest weight ever was ~320 pounds, he craves salty foods, i.e., pretzels and popcorn, he snacks frequently in the evenings, he skips meals frequently, he frequently makes poor food choices, he frequently eats larger portions than normal and he struggles with emotional eating.  Depression Screen Richard [name]'s Food and Mood (modified PHQ-9) score was 12.  Depression screen PHQ 2/9 11/09/2019  Decreased Interest 1  Down, Depressed, Hopeless 2  PHQ - 2 Score 3  Altered sleeping 1  Tired, decreased energy 2  Change in appetite 1  Feeling bad or failure about yourself  3  Trouble concentrating 1  Moving slowly or fidgety/restless 1  Suicidal thoughts 0  PHQ-9 Score 12  Difficult doing work/chores Somewhat difficult   Subjective:   Other fatigue. Richard [name] admits to daytime somnolence and denies waking up still tired. Patent has a history of symptoms of  daytime fatigue and Epworth sleepiness scale. Richard [name] generally gets 5-7 hours of sleep per night, and states that he has generally restful sleep. Snoring is present. Apneic episodes are not present. Epworth Sleepiness Score is 10.  SOB (shortness of breath) on exertion. Richard [name] notes increasing shortness of breath with certain activities and seems to be worsening over time with weight gain. He notes getting out of breath sooner with activity than he used to. This has gotten worse recently. Richard [name] denies shortness of breath at rest or orthopnea.  OSA (obstructive sleep apnea). Richard [name] does not wear CPAP. Sleep study showed mild sleep apnea.  Other hyperlipidemia. Richard [name] is on no medication.   Lab Results  Component Value Date   CHOL 176 01/11/2019   HDL 33.20 (L) 01/11/2019   LDLCALC 113 (H) 01/11/2019   LDLDIRECT 70.0 12/15/2017   TRIG 151.0 (H) 01/11/2019   CHOLHDL 5 01/11/2019   Lab Results  Component Value Date   ALT 23 01/11/2019   AST 19 01/11/2019   ALKPHOS 57 01/11/2019   BILITOT 0.9 01/11/2019   The 10-year ASCVD risk score Richard [name] Richard [name]., et al., 2013) is: 4.1%   Values used to calculate the score:     Age: 20 years     Sex: Male     Is Non-Hispanic African American: No     Diabetic: No     Tobacco smoker: No     Systolic Blood Pressure: 517 mmHg     Is BP treated: No     HDL Cholesterol: 33.2 mg/dL  Total Cholesterol: 176 mg/dL  Chronic foot pain, unspecified laterality. Richard [name] reports heel pain intermittently.  Depression screening. Richard [name] has a moderately positive depression screen with a PHQ-9 score of 12.  At risk for activity intolerance. Richard [name] is at risk of exercise intolerance due to foot pain and obesity.  Assessment/Plan:   Other fatigue. Richard [name] does feel that his weight is causing his energy to be lower than it should be. Fatigue may be related to obesity, depression or many other causes. Labs will be ordered, and in the meanwhile, Richard [name] will focus on self care including making  healthy food choices, increasing physical activity and focusing on stress reduction. EKG 12-Lead, Comprehensive metabolic panel, Hemoglobin A1c, Insulin, random, VITAMIN D 25 Hydroxy (Vit-D Deficiency, Fractures), T3, T4, free, TSH testing ordered today.  SOB (shortness of breath) on exertion. Richard [name] does feel that he gets out of breath more easily that he used to when he exercises. Richard [name]'s shortness of breath appears to be obesity related and exercise induced. He has agreed to work on weight loss and gradually increase exercise to treat his exercise induced shortness of breath. Will continue to monitor closely.   OSA (obstructive sleep apnea). Intensive lifestyle modifications are the first line treatment for this issue. We discussed several lifestyle modifications today and he will continue to work on diet, exercise and weight loss efforts. We will continue to monitor. Orders and follow up as documented in patient record. We discussed with Richard [name] the importance of restful sleep.  Counseling  Sleep apnea is a condition in which breathing pauses or becomes shallow during sleep. This happens over and over during the night. This disrupts your sleep and keeps your body from getting the rest that it needs, which can cause tiredness and lack of energy (fatigue) during the day.  Sleep apnea treatment: If you were given a device to open your airway while you sleep, USE IT!  Sleep hygiene:   Limit or avoid alcohol, caffeinated beverages, and cigarettes, especially close to bedtime.   Do not eat a large meal or eat spicy foods right before bedtime. This can lead to digestive discomfort that can make it hard for you to sleep.  Keep a sleep diary to help you and your health care provider figure out what could be causing your insomnia.  . Make your bedroom a dark, comfortable place where it is easy to fall asleep. ? Put up shades or blackout curtains to block light from outside. ? Use a white noise machine to block  noise. ? Keep the temperature cool. . Limit screen use before bedtime. This includes: ? Watching TV. ? Using your smartphone, tablet, or computer. . Stick to a routine that includes going to bed and waking up at the same times every day and night. This can help you fall asleep faster. Consider making a quiet activity, such as reading, part of your nighttime routine. . Try to avoid taking naps during the day so that you sleep better at night. . Get out of bed if you are still awake after 15 minutes of trying to sleep. Keep the lights down, but try reading or doing a quiet activity. When you feel sleepy, go back to bed.  Other hyperlipidemia. Cardiovascular risk and specific lipid/LDL goals reviewed.  We discussed several lifestyle modifications today and Richard [name] will continue to work on diet, exercise and weight loss efforts. Orders and follow up as documented in patient record. He will avoid trans fats and increase PUFA's and MUFA's.  Counseling Intensive lifestyle modifications are the first line treatment for this issue. . Dietary changes: Increase soluble fiber. Decrease simple carbohydrates. . Exercise changes: Moderate to vigorous-intensity aerobic activity 150 minutes per week if tolerated. . Lipid-lowering medications: see documented in medical record.  Chronic foot pain, unspecified laterality. Jhordan will gradually increase exercise and strengthening.  Depression screening. Richard [name] had a positive depression screening. Depression is commonly associated with obesity and often results in emotional eating behaviors. We will monitor this closely and work on CBT to help improve the non-hunger eating patterns. Referral to Psychology may be required if no improvement is seen as he continues in our clinic.  At risk for activity intolerance. Richard [name] was given approximately 15 minutes of exercise intolerance counseling today. He is 49 y.o. male and has risk factors exercise intolerance including obesity. We  discussed intensive lifestyle modifications today with an emphasis on specific weight loss instructions and strategies. Richard [name] will slowly increase activity as tolerated.  Repetitive spaced learning was employed today to elicit superior memory formation and behavioral change.  Class 2 severe obesity with serious comorbidity and body mass index (BMI) of 39.0 to 39.9 in adult, unspecified obesity type ([place]).  Richard [name] is currently in the action stage of change and his goal is to continue with weight loss efforts. I recommend Richard [name] begin the structured treatment plan as follows:  He has agreed to the Category 4 Plan.  He will work on meal planning and intentional eating.   We independently reviewed with the patient labs from 01/11/2019 including CMP, lipids, CBC, and glucose.  Exercise goals: All adults should avoid inactivity. Some physical activity is better than none, and adults who participate in any amount of physical activity gain some health benefits.   Behavioral modification strategies: increasing lean protein intake, decreasing simple carbohydrates, increasing vegetables, increasing water intake, decreasing eating out, no skipping meals, meal planning and cooking strategies, keeping healthy foods in the home and planning for success.  He was informed of the importance of frequent follow-up visits to maximize his success with intensive lifestyle modifications for his multiple health conditions. He was informed we would discuss his lab results at his next visit unless there is a critical issue that needs to be addressed sooner. Richard [name] agreed to keep his next visit at the agreed upon time to discuss these results.  Objective:   Blood pressure 122/78, pulse (!) 58, temperature 97.9 F (36.6 C), temperature source Oral, height 6' (1.829 m), weight 287 lb (130.2 kg), SpO2 97 %. Body mass index is 38.92 kg/m.  EKG: Sinus  Rhythm with a rate of 70 BPM. Incomplete right bundle branch block and left axis -  anterior fascicular block. Otherwise normal.   Indirect Calorimeter completed today shows a VO2 of 359 and a REE of 2497.  His calculated basal metabolic rate is 2482 thus his basal metabolic rate is worse than expected.  General: Cooperative, alert, well developed, in no acute distress. HEENT: Conjunctivae and lids unremarkable. Cardiovascular: Regular rhythm.  Lungs: Normal work of breathing. Neurologic: No focal deficits.   Lab Results  Component Value Date   CREATININE 1.18 01/11/2019   BUN 14 01/11/2019   NA 141 01/11/2019   K 4.0 01/11/2019   CL 105 01/11/2019   CO2 32 01/11/2019   Lab Results  Component Value Date   ALT 23 01/11/2019   AST 19 01/11/2019   ALKPHOS 57 01/11/2019   BILITOT 0.9 01/11/2019   Lab Results  Component Value Date  HGBA1C 5.2 01/18/2019   HGBA1C 5.2 12/15/2017   HGBA1C 5.6 07/07/2013   No results found for: INSULIN Lab Results  Component Value Date   TSH 2.014 07/07/2013   Lab Results  Component Value Date   CHOL 176 01/11/2019   HDL 33.20 (L) 01/11/2019   LDLCALC 113 (H) 01/11/2019   LDLDIRECT 70.0 12/15/2017   TRIG 151.0 (H) 01/11/2019   CHOLHDL 5 01/11/2019   Lab Results  Component Value Date   WBC 7.0 01/11/2019   HGB 15.0 01/11/2019   HCT 43.0 01/11/2019   MCV 87.3 01/11/2019   PLT 215.0 01/11/2019   No results found for: IRON, TIBC, FERRITIN  Attestation Statements:   Reviewed by clinician on day of visit: allergies, medications, problem list, medical history, surgical history, family history, social history, and previous encounter notes.  Migdalia Dk, am acting as Location manager for [employer], DO   I have reviewed the above documentation for accuracy and completeness, and I agree with the above. Jearld Lesch, DO

## 2019-11-10 ENCOUNTER — Encounter (INDEPENDENT_AMBULATORY_CARE_PROVIDER_SITE_OTHER): Payer: Self-pay | Admitting: Bariatrics

## 2019-11-10 DIAGNOSIS — E88819 Insulin resistance, unspecified: Secondary | ICD-10-CM | POA: Insufficient documentation

## 2019-11-10 DIAGNOSIS — E8881 Metabolic syndrome: Secondary | ICD-10-CM | POA: Insufficient documentation

## 2019-11-10 LAB — COMPREHENSIVE METABOLIC PANEL
ALT: 43 IU/L (ref 0–44)
AST: 25 IU/L (ref 0–40)
Albumin/Globulin Ratio: 2.4 — ABNORMAL HIGH (ref 1.2–2.2)
Albumin: 4.4 g/dL (ref 4.0–5.0)
Alkaline Phosphatase: 77 IU/L (ref 48–121)
BUN/Creatinine Ratio: 14 (ref 9–20)
BUN: 17 mg/dL (ref 6–24)
Bilirubin Total: 0.6 mg/dL (ref 0.0–1.2)
CO2: 26 mmol/L (ref 20–29)
Calcium: 9.5 mg/dL (ref 8.7–10.2)
Chloride: 104 mmol/L (ref 96–106)
Creatinine, Ser: 1.2 mg/dL (ref 0.76–1.27)
GFR calc Af Amer: 81 mL/min/{1.73_m2} (ref >59)
GFR calc non Af Amer: 70 mL/min/{1.73_m2} (ref >59)
Globulin, Total: 1.8 g/dL (ref 1.5–4.5)
Glucose: 104 mg/dL — ABNORMAL HIGH (ref 65–99)
Potassium: 4.7 mmol/L (ref 3.5–5.2)
Sodium: 143 mmol/L (ref 134–144)
Total Protein: 6.2 g/dL (ref 6.0–8.5)

## 2019-11-10 LAB — LIPID PANEL WITH LDL/HDL RATIO
Cholesterol, Total: 181 mg/dL (ref 100–199)
HDL: 34 mg/dL — ABNORMAL LOW (ref >39)
LDL Chol Calc (NIH): 117 mg/dL — ABNORMAL HIGH (ref 0–99)
LDL/HDL Ratio: 3.4 ratio (ref 0.0–3.6)
Triglycerides: 171 mg/dL — ABNORMAL HIGH (ref 0–149)
VLDL Cholesterol Cal: 30 mg/dL (ref 5–40)

## 2019-11-10 LAB — T4, FREE: Free T4: 1.29 ng/dL (ref 0.82–1.77)

## 2019-11-10 LAB — HEMOGLOBIN A1C
Est. average glucose Bld gHb Est-mCnc: 105 mg/dL
Hgb A1c MFr Bld: 5.3 % (ref 4.8–5.6)

## 2019-11-10 LAB — T3: T3, Total: 113 ng/dL (ref 71–180)

## 2019-11-10 LAB — VITAMIN D 25 HYDROXY (VIT D DEFICIENCY, FRACTURES): Vit D, 25-Hydroxy: 24.1 ng/mL — ABNORMAL LOW (ref 30.0–100.0)

## 2019-11-10 LAB — TSH: TSH: 2.63 u[IU]/mL (ref 0.450–4.500)

## 2019-11-10 LAB — INSULIN, RANDOM: INSULIN: 17.4 u[IU]/mL (ref 2.6–24.9)

## 2019-11-16 DIAGNOSIS — F320 Major depressive disorder, single episode, mild: Secondary | ICD-10-CM | POA: Diagnosis not present

## 2019-11-23 ENCOUNTER — Encounter (INDEPENDENT_AMBULATORY_CARE_PROVIDER_SITE_OTHER): Payer: Self-pay | Admitting: Bariatrics

## 2019-11-23 ENCOUNTER — Ambulatory Visit (INDEPENDENT_AMBULATORY_CARE_PROVIDER_SITE_OTHER): Payer: BC Managed Care – PPO | Admitting: Bariatrics

## 2019-11-23 ENCOUNTER — Other Ambulatory Visit: Payer: Self-pay

## 2019-11-23 VITALS — BP 113/74 | HR 73 | Temp 98.1°F | Ht 72.0 in | Wt 281.0 lb

## 2019-11-23 DIAGNOSIS — Z9189 Other specified personal risk factors, not elsewhere classified: Secondary | ICD-10-CM | POA: Diagnosis not present

## 2019-11-23 DIAGNOSIS — E559 Vitamin D deficiency, unspecified: Secondary | ICD-10-CM

## 2019-11-23 DIAGNOSIS — E8881 Metabolic syndrome: Secondary | ICD-10-CM | POA: Diagnosis not present

## 2019-11-23 DIAGNOSIS — F320 Major depressive disorder, single episode, mild: Secondary | ICD-10-CM | POA: Diagnosis not present

## 2019-11-23 DIAGNOSIS — E785 Hyperlipidemia, unspecified: Secondary | ICD-10-CM | POA: Diagnosis not present

## 2019-11-23 DIAGNOSIS — Z6838 Body mass index (BMI) 38.0-38.9, adult: Secondary | ICD-10-CM

## 2019-11-23 MED ORDER — VITAMIN D (ERGOCALCIFEROL) 1.25 MG (50000 UNIT) PO CAPS: 50000.0000 [IU] | ORAL_CAPSULE | ORAL | 0 refills | Status: DC

## 2019-11-28 NOTE — Progress Notes (Signed)
Chief Complaint:   OBESITY Richard [name] is here to discuss his progress with his obesity treatment plan along with follow-up of his obesity related diagnoses. Richard [name] is on the Category 4 Plan and states he is following his eating plan approximately 95% of the time. Richard [name] states he is walking for 30 minutes 4 times per week.  Today's visit was #: 2 Starting weight: 287 lbs Starting date: 11/09/2019 Today's weight: 281 lbs Today's date: 11/23/2019 Total lbs lost to date: 6 lbs Total lbs lost since last in-office visit: 6 lbs  Interim History: Richard [name] is down 6 pounds.  He states that he did not find it hard.  He is in the "groove".  Subjective:   1. Vitamin D deficiency [place]'s Vitamin D level was 24.1 on 11/09/2019. He is currently taking prescription vitamin D 50,000 IU each week. He denies nausea, vomiting or muscle weakness.    2. Dyslipidemia, mild Richard [name] has dyslipidemia and has been trying to improve his cholesterol levels with intensive lifestyle modification including a low saturated fat diet, exercise and weight loss. He denies any chest pain, claudication or myalgias.  He is on no medications.  Lab Results  Component Value Date   ALT 43 11/09/2019   AST 25 11/09/2019   ALKPHOS 77 11/09/2019   BILITOT 0.6 11/09/2019   Lab Results  Component Value Date   CHOL 181 11/09/2019   HDL 34 (L) 11/09/2019   LDLCALC 117 (H) 11/09/2019   LDLDIRECT 70.0 12/15/2017   TRIG 171 (H) 11/09/2019   CHOLHDL 5 01/11/2019   3. Insulin resistance Richard [name] has a diagnosis of insulin resistance based on his elevated fasting insulin level >5. He continues to work on diet and exercise to decrease his risk of diabetes.  Lab Results  Component Value Date   INSULIN 17.4 11/09/2019   Lab Results  Component Value Date   HGBA1C 5.3 11/09/2019   4. At risk for diabetes mellitus Richard [name] is at higher than average risk for developing diabetes due to his obesity and IR.   Assessment/Plan:   1. Vitamin D deficiency Low  Vitamin D level contributes to fatigue and are associated with obesity, breast, and colon cancer. He agrees to continue to take prescription Vitamin D @50 ,000 IU every week and will follow-up for routine testing of Vitamin D, at least 2-3 times per year to avoid over-replacement.  -Refill Vitamin D, Ergocalciferol, (DRISDOL) 1.25 MG (50000 UNIT) CAPS capsule; Take 1 capsule (50,000 Units total) by mouth every 7 (seven) days.  Dispense: 5 capsule; Refill: 0  2. Dyslipidemia, mild Cardiovascular risk and specific lipid/LDL goals reviewed.  We discussed several lifestyle modifications today and Richard [name] will continue to work on diet, exercise and weight loss efforts. Orders and follow up as documented in patient record.  Eliminate trans fats and increase PUFAs and MUFAs.  Counseling Intensive lifestyle modifications are the first line treatment for this issue. . Dietary changes: Increase soluble fiber. Decrease simple carbohydrates. . Exercise changes: Moderate to vigorous-intensity aerobic activity 150 minutes per week if tolerated. . Lipid-lowering medications: see documented in medical record.  3. Insulin resistance Richard [name] will continue to work on weight loss, exercise, and decreasing simple carbohydrates to help decrease the risk of diabetes. Richard [name] agreed to follow-up with [place] as directed to closely monitor his progress.  Decrease carbohydrates, increase activity, and handout given on IR and prediabetes.  4. At risk for diabetes mellitus Richard [name] was given approximately 15 minutes of diabetes education and counseling today. We  discussed intensive lifestyle modifications today with an emphasis on weight loss as well as increasing exercise and decreasing simple carbohydrates in his diet. We also reviewed medication options with an emphasis on risk versus benefit of those discussed.   Repetitive spaced learning was employed today to elicit superior memory formation and behavioral change.  5. Class 2 severe  obesity with serious comorbidity and body mass index (BMI) of 38.0 to 38.9 in adult, unspecified obesity type ([place]) Richard [name] is currently in the action stage of change. As such, his goal is to continue with weight loss efforts. He has agreed to the Category 4 Plan.   He will work on meal planning and intentional eating.  Reviewed labs from 11/09/2019 with him including CMP, lipid panel, vitamin D, A1c, insulin and thyroid panel.  Exercise goals: Walking for 4 days a week.  Behavioral modification strategies: increasing lean protein intake, decreasing simple carbohydrates, increasing vegetables, increasing water intake, decreasing eating out, no skipping meals, meal planning and cooking strategies, keeping healthy foods in the home and planning for success.  Richard [name] has agreed to follow-up with our clinic in 2 weeks. He was informed of the importance of frequent follow-up visits to maximize his success with intensive lifestyle modifications for his multiple health conditions.   Objective:   Blood pressure 113/74, pulse 73, temperature 98.1 F (36.7 C), height 6' (1.829 m), weight 281 lb (127.5 kg), SpO2 94 %. Body mass index is 38.11 kg/m.  General: Cooperative, alert, well developed, in no acute distress. HEENT: Conjunctivae and lids unremarkable. Cardiovascular: Regular rhythm.  Lungs: Normal work of breathing. Neurologic: No focal deficits.   Lab Results  Component Value Date   CREATININE 1.20 11/09/2019   BUN 17 11/09/2019   NA 143 11/09/2019   K 4.7 11/09/2019   CL 104 11/09/2019   CO2 26 11/09/2019   Lab Results  Component Value Date   ALT 43 11/09/2019   AST 25 11/09/2019   ALKPHOS 77 11/09/2019   BILITOT 0.6 11/09/2019   Lab Results  Component Value Date   HGBA1C 5.3 11/09/2019   HGBA1C 5.2 01/18/2019   HGBA1C 5.2 12/15/2017   HGBA1C 5.6 07/07/2013   Lab Results  Component Value Date   INSULIN 17.4 11/09/2019   Lab Results  Component Value Date   TSH 2.630 11/09/2019    Lab Results  Component Value Date   CHOL 181 11/09/2019   HDL 34 (L) 11/09/2019   LDLCALC 117 (H) 11/09/2019   LDLDIRECT 70.0 12/15/2017   TRIG 171 (H) 11/09/2019   CHOLHDL 5 01/11/2019   Lab Results  Component Value Date   WBC 7.0 01/11/2019   HGB 15.0 01/11/2019   HCT 43.0 01/11/2019   MCV 87.3 01/11/2019   PLT 215.0 01/11/2019   Attestation Statements:   Reviewed by clinician on day of visit: allergies, medications, problem list, medical history, surgical history, family history, social history, and previous encounter notes.  I, Water quality scientist, CMA, am acting as Location manager for [employer], DO  I have reviewed the above documentation for accuracy and completeness, and I agree with the above. Jearld Lesch, DO

## 2019-11-29 ENCOUNTER — Encounter (INDEPENDENT_AMBULATORY_CARE_PROVIDER_SITE_OTHER): Payer: Self-pay | Admitting: Bariatrics

## 2019-11-30 DIAGNOSIS — F320 Major depressive disorder, single episode, mild: Secondary | ICD-10-CM | POA: Diagnosis not present

## 2019-12-01 ENCOUNTER — Encounter (INDEPENDENT_AMBULATORY_CARE_PROVIDER_SITE_OTHER): Payer: Self-pay

## 2019-12-07 ENCOUNTER — Ambulatory Visit (INDEPENDENT_AMBULATORY_CARE_PROVIDER_SITE_OTHER): Payer: BC Managed Care – PPO | Admitting: Bariatrics

## 2019-12-07 ENCOUNTER — Encounter (INDEPENDENT_AMBULATORY_CARE_PROVIDER_SITE_OTHER): Payer: Self-pay | Admitting: Bariatrics

## 2019-12-07 ENCOUNTER — Other Ambulatory Visit: Payer: Self-pay

## 2019-12-07 VITALS — BP 128/75 | HR 74 | Temp 98.1°F | Ht 72.0 in | Wt 275.0 lb

## 2019-12-07 DIAGNOSIS — E8881 Metabolic syndrome: Secondary | ICD-10-CM | POA: Diagnosis not present

## 2019-12-07 DIAGNOSIS — E88819 Insulin resistance, unspecified: Secondary | ICD-10-CM

## 2019-12-07 DIAGNOSIS — Z6837 Body mass index (BMI) 37.0-37.9, adult: Secondary | ICD-10-CM | POA: Diagnosis not present

## 2019-12-07 DIAGNOSIS — Z9189 Other specified personal risk factors, not elsewhere classified: Secondary | ICD-10-CM | POA: Diagnosis not present

## 2019-12-07 DIAGNOSIS — E66812 Obesity, class 2: Secondary | ICD-10-CM

## 2019-12-07 DIAGNOSIS — F320 Major depressive disorder, single episode, mild: Secondary | ICD-10-CM | POA: Diagnosis not present

## 2019-12-07 DIAGNOSIS — E559 Vitamin D deficiency, unspecified: Secondary | ICD-10-CM

## 2019-12-07 MED ORDER — VITAMIN D (ERGOCALCIFEROL) 1.25 MG (50000 UNIT) PO CAPS: 50000.0000 [IU] | ORAL_CAPSULE | ORAL | 0 refills | Status: DC

## 2019-12-12 NOTE — Progress Notes (Signed)
Chief Complaint:   OBESITY Richard [name] is here to discuss his progress with his obesity treatment plan along with follow-up of his obesity related diagnoses. Richard [name] is on the Category 4 Plan and states he is following his eating plan approximately 75% of the time. Richard [name] states he is walking for 30 minutes 4-6 times per week.  Today's visit was #: 3 Starting weight: 287 lbs Starting date: 11/09/2019 Today's weight: 275 lbs Today's date: 12/07/2019 Total lbs lost to date: 12 lbs Total lbs lost since last in-office visit: 6 lbs  Interim History: Richard [name] is down 6 pounds since his last visit and doing well overall.  He is drinking adequate water.  Subjective:   1. Vitamin D deficiency Richard [name]'s Vitamin D level was 24.1 on 11/09/2019. He is currently taking prescription vitamin D 50,000 IU each week. He denies nausea, vomiting or muscle weakness.  2. Insulin resistance Richard [name] has a diagnosis of insulin resistance based on his elevated fasting insulin level >5. He continues to work on diet and exercise to decrease his risk of diabetes.  No medications.  Denies polyphagia.  Lab Results  Component Value Date   INSULIN 17.4 11/09/2019   Lab Results  Component Value Date   HGBA1C 5.3 11/09/2019   3. At risk for osteoporosis Richard [name] is at higher risk of osteopenia and osteoporosis due to Vitamin D deficiency.   Assessment/Plan:   1. Vitamin D deficiency Low Vitamin D level contributes to fatigue and are associated with obesity, breast, and colon cancer. He agrees to continue to take prescription Vitamin D @50 ,000 IU every week and will follow-up for routine testing of Vitamin D, at least 2-3 times per year to avoid over-replacement.    -Refill Vitamin D, Ergocalciferol, (DRISDOL) 1.25 MG (50000 UNIT) CAPS capsule; Take 1 capsule (50,000 Units total) by mouth every 7 (seven) days.  Dispense: 5 capsule; Refill: 0  2. Insulin resistance Richard [name] will continue to work on weight loss, exercise, and decreasing simple  carbohydrates to help decrease the risk of diabetes. Richard [name] agreed to follow-up with [place] as directed to closely monitor his progress.  Decrease carbohydrates, increase healthy fats and protein, increase activity.  3. At risk for osteoporosis Richard [name] was given approximately 15 minutes of osteoporosis prevention counseling today. Richard [name] is at risk for osteopenia and osteoporosis due to his Vitamin D deficiency. He was encouraged to take his Vitamin D and follow his higher calcium diet and increase strengthening exercise to help strengthen his bones and decrease his risk of osteopenia and osteoporosis.  Repetitive spaced learning was employed today to elicit superior memory formation and behavioral change.  4. Class 2 severe obesity with serious comorbidity and body mass index (BMI) of 37.0 to 37.9 in adult, unspecified obesity type ([place])  Richard [name] is currently in the action stage of change. As such, his goal is to continue with weight loss efforts. He has agreed to the Category 4 Plan.   Exercise goals: Increase exercise and sleep well.   He will work on meal planning and intentional eating.  Behavioral modification strategies: increasing lean protein intake, decreasing simple carbohydrates, increasing vegetables, increasing water intake, decreasing eating out, no skipping meals, meal planning and cooking strategies, keeping healthy foods in the home and planning for success.  Richard [name] has agreed to follow-up with our clinic in 3 weeks. He was informed of the importance of frequent follow-up visits to maximize his success with intensive lifestyle modifications for his multiple health conditions.   Objective:  Blood pressure 128/75, pulse 74, temperature 98.1 F (36.7 C), temperature source Oral, height 6' (1.829 m), weight 275 lb (124.7 kg), SpO2 95 %. Body mass index is 37.3 kg/m.  General: Cooperative, alert, well developed, in no acute distress. HEENT: Conjunctivae and lids unremarkable. Cardiovascular:  Regular rhythm.  Lungs: Normal work of breathing. Neurologic: No focal deficits.   Lab Results  Component Value Date   CREATININE 1.20 11/09/2019   BUN 17 11/09/2019   NA 143 11/09/2019   K 4.7 11/09/2019   CL 104 11/09/2019   CO2 26 11/09/2019   Lab Results  Component Value Date   ALT 43 11/09/2019   AST 25 11/09/2019   ALKPHOS 77 11/09/2019   BILITOT 0.6 11/09/2019   Lab Results  Component Value Date   HGBA1C 5.3 11/09/2019   HGBA1C 5.2 01/18/2019   HGBA1C 5.2 12/15/2017   HGBA1C 5.6 07/07/2013   Lab Results  Component Value Date   INSULIN 17.4 11/09/2019   Lab Results  Component Value Date   TSH 2.630 11/09/2019   Lab Results  Component Value Date   CHOL 181 11/09/2019   HDL 34 (L) 11/09/2019   LDLCALC 117 (H) 11/09/2019   LDLDIRECT 70.0 12/15/2017   TRIG 171 (H) 11/09/2019   CHOLHDL 5 01/11/2019   Lab Results  Component Value Date   WBC 7.0 01/11/2019   HGB 15.0 01/11/2019   HCT 43.0 01/11/2019   MCV 87.3 01/11/2019   PLT 215.0 01/11/2019   Attestation Statements:   Reviewed by clinician on day of visit: allergies, medications, problem list, medical history, surgical history, family history, social history, and previous encounter notes.  I, Water quality scientist, CMA, am acting as Location manager for [employer], DO  I have reviewed the above documentation for accuracy and completeness, and I agree with the above. Jearld Lesch, DO

## 2019-12-13 ENCOUNTER — Encounter (INDEPENDENT_AMBULATORY_CARE_PROVIDER_SITE_OTHER): Payer: Self-pay | Admitting: Bariatrics

## 2019-12-14 DIAGNOSIS — F320 Major depressive disorder, single episode, mild: Secondary | ICD-10-CM | POA: Diagnosis not present

## 2019-12-21 DIAGNOSIS — F320 Major depressive disorder, single episode, mild: Secondary | ICD-10-CM | POA: Diagnosis not present

## 2019-12-28 DIAGNOSIS — F320 Major depressive disorder, single episode, mild: Secondary | ICD-10-CM | POA: Diagnosis not present

## 2019-12-29 ENCOUNTER — Encounter (INDEPENDENT_AMBULATORY_CARE_PROVIDER_SITE_OTHER): Payer: Self-pay | Admitting: Bariatrics

## 2019-12-29 ENCOUNTER — Ambulatory Visit (INDEPENDENT_AMBULATORY_CARE_PROVIDER_SITE_OTHER): Payer: BC Managed Care – PPO | Admitting: Bariatrics

## 2019-12-29 ENCOUNTER — Other Ambulatory Visit: Payer: Self-pay

## 2019-12-29 VITALS — BP 113/74 | HR 61 | Temp 97.8°F | Ht 72.0 in | Wt 271.0 lb

## 2019-12-29 DIAGNOSIS — E559 Vitamin D deficiency, unspecified: Secondary | ICD-10-CM

## 2019-12-29 DIAGNOSIS — Z9189 Other specified personal risk factors, not elsewhere classified: Secondary | ICD-10-CM

## 2019-12-29 DIAGNOSIS — E785 Hyperlipidemia, unspecified: Secondary | ICD-10-CM | POA: Diagnosis not present

## 2019-12-29 DIAGNOSIS — Z6836 Body mass index (BMI) 36.0-36.9, adult: Secondary | ICD-10-CM | POA: Diagnosis not present

## 2019-12-29 MED ORDER — VITAMIN D (ERGOCALCIFEROL) 1.25 MG (50000 UNIT) PO CAPS: 50000.0000 [IU] | ORAL_CAPSULE | ORAL | 0 refills | Status: DC

## 2020-01-02 ENCOUNTER — Encounter (INDEPENDENT_AMBULATORY_CARE_PROVIDER_SITE_OTHER): Payer: Self-pay | Admitting: Bariatrics

## 2020-01-02 NOTE — Progress Notes (Signed)
Chief Complaint:   OBESITY Richard [name] is here to discuss his progress with his obesity treatment plan along with follow-up of his obesity related diagnoses. [name] is on the Category 4 Plan and states he is following his eating plan approximately 95% of the time. Richard [name] states he is walking 30-40 minutes 3-4 times per week.  Today's visit was #: 4 Starting weight: 287 lbs Starting date: 11/09/2019 Today's weight: 271 lbs Today's date: 12/29/2019 Total lbs lost to date: 16 Total lbs lost since last in-office visit: 4  Interim History: Richard [name] is down an additional 4 lbs and doing well overall. He has traveled, but doing okay with that at this time.  Subjective:   Dyslipidemia. Richard [name] is on no medication.  Vitamin D deficiency. No nausea, vomiting, or muscle weakness.    Ref. Range 11/09/2019 08:47  Vitamin D, 25-Hydroxy Latest Ref Range: 30.0 - 100.0 ng/mL 24.1 (L)   At risk for heart disease. Richard [name] is at a higher than average risk for cardiovascular disease due to dyslipidemia and obesity.   Assessment/Plan:   Dyslipidemia. Richard [name] will continue to work on diet and exercise for lipid control.  Vitamin D deficiency. Low Vitamin D level contributes to fatigue and are associated with obesity, breast, and colon cancer. He was given a prescription for Vitamin D, Ergocalciferol, (DRISDOL) 1.25 MG (50000 UNIT) CAPS capsule every week #4 with 0 refills and will follow-up for routine testing of Vitamin D, at least 2-3 times per year to avoid over-replacement.   At risk for heart disease. Richard [name] was given approximately 15 minutes of coronary artery disease prevention counseling today. He is 49 y.o. male and has risk factors for heart disease including obesity. We discussed intensive lifestyle modifications today with an emphasis on specific weight loss instructions and strategies.   Repetitive spaced learning was employed today to elicit superior memory formation and behavioral change.  Class 2 severe  obesity with serious comorbidity and body mass index (BMI) of 36.0 to 36.9 in adult, unspecified obesity type ([name]).  Richard [name] is currently in the action stage of change. As such, his goal is to continue with weight loss efforts. He has agreed to the Category 4 Plan and will journal 1800 calories and 100-120 grams of protein.   Exercise goals: Richard [name] will continue walking and will use weights at home.  Behavioral modification strategies: increasing lean protein intake, decreasing simple carbohydrates, increasing vegetables, increasing water intake, decreasing eating out, no skipping meals, meal planning and cooking strategies, keeping healthy foods in the home and planning for success.  Richard [name] has agreed to follow-up with our clinic in 3 weeks. He was informed of the importance of frequent follow-up visits to maximize his success with intensive lifestyle modifications for his multiple health conditions.   Objective:   Blood pressure 113/74, pulse 61, temperature 97.8 F (36.6 C), height 6' (1.829 m), weight 271 lb (122.9 kg), SpO2 100 %. Body mass index is 36.75 kg/m.  General: Cooperative, alert, well developed, in no acute distress. HEENT: Conjunctivae and lids unremarkable. Cardiovascular: Regular rhythm.  Lungs: Normal work of breathing. Neurologic: No focal deficits.   Lab Results  Component Value Date   CREATININE 1.20 11/09/2019   BUN 17 11/09/2019   NA 143 11/09/2019   K 4.7 11/09/2019   CL 104 11/09/2019   CO2 26 11/09/2019   Lab Results  Component Value Date   ALT 43 11/09/2019   AST 25 11/09/2019   ALKPHOS 77 11/09/2019   BILITOT  0.6 11/09/2019   Lab Results  Component Value Date   HGBA1C 5.3 11/09/2019   HGBA1C 5.2 01/18/2019   HGBA1C 5.2 12/15/2017   HGBA1C 5.6 07/07/2013   Lab Results  Component Value Date   INSULIN 17.4 11/09/2019   Lab Results  Component Value Date   TSH 2.630 11/09/2019   Lab Results  Component Value Date   CHOL 181 11/09/2019   HDL 34  (L) 11/09/2019   LDLCALC 117 (H) 11/09/2019   LDLDIRECT 70.0 12/15/2017   TRIG 171 (H) 11/09/2019   CHOLHDL 5 01/11/2019   Lab Results  Component Value Date   WBC 7.0 01/11/2019   HGB 15.0 01/11/2019   HCT 43.0 01/11/2019   MCV 87.3 01/11/2019   PLT 215.0 01/11/2019   No results found for: IRON, TIBC, FERRITIN  Attestation Statements:   Reviewed by clinician on day of visit: allergies, medications, problem list, medical history, surgical history, family history, social history, and previous encounter notes.  Migdalia Dk, am acting as Location manager for [employer], DO   I have reviewed the above documentation for accuracy and completeness, and I agree with the above. Jearld Lesch, DO

## 2020-01-04 DIAGNOSIS — F320 Major depressive disorder, single episode, mild: Secondary | ICD-10-CM | POA: Diagnosis not present

## 2020-01-12 ENCOUNTER — Telehealth: Payer: Self-pay

## 2020-01-12 ENCOUNTER — Other Ambulatory Visit: Payer: Self-pay | Admitting: Primary Care

## 2020-01-12 NOTE — Telephone Encounter (Signed)
Left VM to notify pt he does not need to come for lab visit 11.2.2021 per Allie Bossier

## 2020-01-17 ENCOUNTER — Other Ambulatory Visit: Payer: BC Managed Care – PPO

## 2020-01-18 DIAGNOSIS — F320 Major depressive disorder, single episode, mild: Secondary | ICD-10-CM | POA: Diagnosis not present

## 2020-01-20 ENCOUNTER — Encounter: Payer: Self-pay | Admitting: Primary Care

## 2020-01-23 ENCOUNTER — Ambulatory Visit (INDEPENDENT_AMBULATORY_CARE_PROVIDER_SITE_OTHER): Payer: BC Managed Care – PPO | Admitting: Bariatrics

## 2020-01-23 ENCOUNTER — Encounter (INDEPENDENT_AMBULATORY_CARE_PROVIDER_SITE_OTHER): Payer: Self-pay | Admitting: Bariatrics

## 2020-01-23 ENCOUNTER — Other Ambulatory Visit: Payer: Self-pay

## 2020-01-23 VITALS — BP 136/80 | HR 75 | Temp 98.2°F | Ht 72.0 in | Wt 278.0 lb

## 2020-01-23 DIAGNOSIS — E559 Vitamin D deficiency, unspecified: Secondary | ICD-10-CM | POA: Diagnosis not present

## 2020-01-23 DIAGNOSIS — E8881 Metabolic syndrome: Secondary | ICD-10-CM | POA: Diagnosis not present

## 2020-01-23 DIAGNOSIS — Z9189 Other specified personal risk factors, not elsewhere classified: Secondary | ICD-10-CM

## 2020-01-23 DIAGNOSIS — Z6837 Body mass index (BMI) 37.0-37.9, adult: Secondary | ICD-10-CM

## 2020-01-23 MED ORDER — VITAMIN D (ERGOCALCIFEROL) 1.25 MG (50000 UNIT) PO CAPS: 50000.0000 [IU] | ORAL_CAPSULE | ORAL | 0 refills | Status: DC

## 2020-01-24 ENCOUNTER — Encounter (INDEPENDENT_AMBULATORY_CARE_PROVIDER_SITE_OTHER): Payer: Self-pay | Admitting: Bariatrics

## 2020-01-24 NOTE — Progress Notes (Signed)
Chief Complaint:   OBESITY Richard [name] is here to discuss his progress with his obesity treatment plan along with follow-up of his obesity related diagnoses. Richard [name] is on the Category 4 Plan and states he is following his eating plan approximately 50% of the time. Richard [name] states he is exercising 0 minutes 0 times per week.  Today's visit was #: 5 Starting weight: 287 lbs Starting date: 11/09/2019 Today's weight: 278 lbs Today's date: 01/23/2020 Total lbs lost to date: 9 Total lbs lost since last in-office visit: 0  Interim History: Richard [name] is up 7 lbs, but has done well overall. His work has been erratic and off his schedule.  Subjective:   Vitamin D deficiency. Richard [name] is on Vitamin D supplementation and reports he is taking it as directed.   Ref. Range 11/09/2019 08:47  Vitamin D, 25-Hydroxy Latest Ref Range: 30.0 - 100.0 ng/mL 24.1 (L)   Insulin resistance. Richard [name] has a diagnosis of insulin resistance based on his elevated fasting insulin level >5. He continues to work on diet and exercise to decrease his risk of diabetes. He reports appetite is slightly increased.  Lab Results  Component Value Date   INSULIN 17.4 11/09/2019   Lab Results  Component Value Date   HGBA1C 5.3 11/09/2019   At risk for diabetes mellitus. Richard [name] is at higher than average risk for developing diabetes due to insulin resistance.   Assessment/Plan:   Vitamin D deficiency. Low Vitamin D level contributes to fatigue and are associated with obesity, breast, and colon cancer. He was given a prescription for Vitamin D, Ergocalciferol, (DRISDOL) 1.25 MG (50000 UNIT) CAPS capsule every week #4 with 0 refills and will follow-up for routine testing of Vitamin D, at least 2-3 times per year to avoid over-replacement.   Insulin resistance. Richard [name] will continue to work on weight loss, increasing exercise, and decreasing simple carbohydrates to help decrease the risk of diabetes. Richard [name] agreed to follow-up with [place] as directed to closely  monitor his progress.  At risk for diabetes mellitus. Richard [name] was given approximately 15 minutes of diabetes education and counseling today. We discussed intensive lifestyle modifications today with an emphasis on weight loss as well as increasing exercise and decreasing simple carbohydrates in his diet. We also reviewed medication options with an emphasis on risk versus benefit of those discussed.   Repetitive spaced learning was employed today to elicit superior memory formation and behavioral change.  Class 2 severe obesity with serious comorbidity and body mass index (BMI) of 37.0 to 37.9 in adult, unspecified obesity type (Richard [name]).  Richard [name] is currently in the action stage of change. As such, his goal is to continue with weight loss efforts. He has agreed to the Category 4 Plan.   He will work on meal planning, intentional eating, increasing sleep, decreasing sodium intake, and increasing water intake.  Exercise goals: Richard [name] will resume walking for exercise.  Behavioral modification strategies: increasing lean protein intake, decreasing simple carbohydrates, increasing vegetables, increasing water intake, decreasing eating out, no skipping meals, meal planning and cooking strategies, keeping healthy foods in the home and planning for success.  Richard [name] has agreed to follow-up with our clinic in 2-3 weeks. He was informed of the importance of frequent follow-up visits to maximize his success with intensive lifestyle modifications for his multiple health conditions.   Objective:   Blood pressure 136/80, pulse 75, temperature 98.2 F (36.8 C), temperature source Oral, height 6' (1.829 m), weight 278 lb (126.1 kg), SpO2 95 %. Body  mass index is 37.7 kg/m.  General: Cooperative, alert, well developed, in no acute distress. HEENT: Conjunctivae and lids unremarkable. Cardiovascular: Regular rhythm.  Lungs: Normal work of breathing. Neurologic: No focal deficits.   Lab Results  Component Value Date    CREATININE 1.20 11/09/2019   BUN 17 11/09/2019   NA 143 11/09/2019   K 4.7 11/09/2019   CL 104 11/09/2019   CO2 26 11/09/2019   Lab Results  Component Value Date   ALT 43 11/09/2019   AST 25 11/09/2019   ALKPHOS 77 11/09/2019   BILITOT 0.6 11/09/2019   Lab Results  Component Value Date   HGBA1C 5.3 11/09/2019   HGBA1C 5.2 01/18/2019   HGBA1C 5.2 12/15/2017   HGBA1C 5.6 07/07/2013   Lab Results  Component Value Date   INSULIN 17.4 11/09/2019   Lab Results  Component Value Date   TSH 2.630 11/09/2019   Lab Results  Component Value Date   CHOL 181 11/09/2019   HDL 34 (L) 11/09/2019   LDLCALC 117 (H) 11/09/2019   LDLDIRECT 70.0 12/15/2017   TRIG 171 (H) 11/09/2019   CHOLHDL 5 01/11/2019   Lab Results  Component Value Date   WBC 7.0 01/11/2019   HGB 15.0 01/11/2019   HCT 43.0 01/11/2019   MCV 87.3 01/11/2019   PLT 215.0 01/11/2019   No results found for: IRON, TIBC, FERRITIN  Attestation Statements:   Reviewed by clinician on day of visit: allergies, medications, problem list, medical history, surgical history, family history, social history, and previous encounter notes.  Migdalia Dk, am acting as Location manager for [employer], DO   I have reviewed the above documentation for accuracy and completeness, and I agree with the above. Jearld Lesch, DO

## 2020-01-25 DIAGNOSIS — F320 Major depressive disorder, single episode, mild: Secondary | ICD-10-CM | POA: Diagnosis not present

## 2020-02-01 DIAGNOSIS — F320 Major depressive disorder, single episode, mild: Secondary | ICD-10-CM | POA: Diagnosis not present

## 2020-02-08 DIAGNOSIS — F320 Major depressive disorder, single episode, mild: Secondary | ICD-10-CM | POA: Diagnosis not present

## 2020-02-15 DIAGNOSIS — F320 Major depressive disorder, single episode, mild: Secondary | ICD-10-CM | POA: Diagnosis not present

## 2020-02-16 ENCOUNTER — Other Ambulatory Visit: Payer: Self-pay

## 2020-02-16 ENCOUNTER — Ambulatory Visit (INDEPENDENT_AMBULATORY_CARE_PROVIDER_SITE_OTHER): Payer: BC Managed Care – PPO | Admitting: Bariatrics

## 2020-02-16 ENCOUNTER — Encounter (INDEPENDENT_AMBULATORY_CARE_PROVIDER_SITE_OTHER): Payer: Self-pay | Admitting: Bariatrics

## 2020-02-16 VITALS — BP 130/81 | HR 69 | Temp 97.5°F | Ht 72.0 in | Wt 276.0 lb

## 2020-02-16 DIAGNOSIS — E8881 Metabolic syndrome: Secondary | ICD-10-CM | POA: Diagnosis not present

## 2020-02-16 DIAGNOSIS — E559 Vitamin D deficiency, unspecified: Secondary | ICD-10-CM | POA: Diagnosis not present

## 2020-02-16 DIAGNOSIS — Z6837 Body mass index (BMI) 37.0-37.9, adult: Secondary | ICD-10-CM | POA: Diagnosis not present

## 2020-02-16 NOTE — Progress Notes (Signed)
Chief Complaint:   OBESITY Richard [name] is here to discuss his progress with his obesity treatment plan along with follow-up of his obesity related diagnoses. Richard [name] is on the Category 4 Plan and states he is following his eating plan approximately 20% of the time. [name] states he is exercising 0 minutes 0 times per week.  Today's visit was #: 6 Starting weight: 287 lbs Starting date: 11/09/2019 Today's weight: 276 lbs Today's date: 02/16/2020 Total lbs lost to date: 11 Total lbs lost since last in-office visit: 2  Interim History: Richard [name] is down 2 lbs since his last visit. He has been traveling a lot for work.  Subjective:   Insulin resistance. [name] has a diagnosis of insulin resistance based on his elevated fasting insulin level >5. He continues to work on diet and exercise to decrease his risk of diabetes. No polyphagia.  Lab Results  Component Value Date   INSULIN 17.4 11/09/2019   Lab Results  Component Value Date   HGBA1C 5.3 11/09/2019   Vitamin D deficiency. Richard [name] is taking Vitamin D supplementation.    Ref. Range 11/09/2019 08:47  Vitamin D, 25-Hydroxy Latest Ref Range: 30.0 - 100.0 ng/mL 24.1 (L)   Assessment/Plan:   Insulin resistance. Richard [name] will continue to work on weight loss, increasing activities, and decreasing simple carbohydrates to help decrease the risk of diabetes. Richard [name] agreed to follow-up with [place] as directed to closely monitor his progress.  Vitamin D deficiency. Low Vitamin D level contributes to fatigue and are associated with obesity, breast, and colon cancer. He agrees to continue to take Vitamin D as directed and will follow-up for routine testing of Vitamin D, at least 2-3 times per year to avoid over-replacement.  Class 2 severe obesity with serious comorbidity and body mass index (BMI) of 37.0 to 37.9 in adult, unspecified obesity type ([name]).  Senon is currently in the action stage of change. As such, his goal is to continue with weight loss efforts. He has  agreed to practicing portion control and making smarter food choices, such as increasing vegetables and decreasing simple carbohydrates.   He will be more adherent to the plan, work on meal planning, and will eat regularly.  Exercise goals: All adults should avoid inactivity. Some physical activity is better than none, and adults who participate in any amount of physical activity gain some health benefits.  Behavioral modification strategies: increasing lean protein intake, decreasing simple carbohydrates, increasing vegetables, increasing water intake, decreasing eating out, no skipping meals, meal planning and cooking strategies, keeping healthy foods in the home, dealing with family or coworker sabotage, travel eating strategies, holiday eating strategies  and celebration eating strategies.  Richard [name] has agreed to follow-up with our clinic fasting in 3 weeks. He was informed of the importance of frequent follow-up visits to maximize his success with intensive lifestyle modifications for his multiple health conditions.   Objective:   Blood pressure 130/81, pulse 69, temperature (!) 97.5 F (36.4 C), height 6' (1.829 m), weight 276 lb (125.2 kg), SpO2 96 %. Body mass index is 37.43 kg/m.  General: Cooperative, alert, well developed, in no acute distress. HEENT: Conjunctivae and lids unremarkable. Cardiovascular: Regular rhythm.  Lungs: Normal work of breathing. Neurologic: No focal deficits.   Lab Results  Component Value Date   CREATININE 1.20 11/09/2019   BUN 17 11/09/2019   NA 143 11/09/2019   K 4.7 11/09/2019   CL 104 11/09/2019   CO2 26 11/09/2019   Lab Results  Component  Value Date   ALT 43 11/09/2019   AST 25 11/09/2019   ALKPHOS 77 11/09/2019   BILITOT 0.6 11/09/2019   Lab Results  Component Value Date   HGBA1C 5.3 11/09/2019   HGBA1C 5.2 01/18/2019   HGBA1C 5.2 12/15/2017   HGBA1C 5.6 07/07/2013   Lab Results  Component Value Date   INSULIN 17.4 11/09/2019   Lab  Results  Component Value Date   TSH 2.630 11/09/2019   Lab Results  Component Value Date   CHOL 181 11/09/2019   HDL 34 (L) 11/09/2019   LDLCALC 117 (H) 11/09/2019   LDLDIRECT 70.0 12/15/2017   TRIG 171 (H) 11/09/2019   CHOLHDL 5 01/11/2019   Lab Results  Component Value Date   WBC 7.0 01/11/2019   HGB 15.0 01/11/2019   HCT 43.0 01/11/2019   MCV 87.3 01/11/2019   PLT 215.0 01/11/2019   No results found for: IRON, TIBC, FERRITIN  Attestation Statements:   Reviewed by clinician on day of visit: allergies, medications, problem list, medical history, surgical history, family history, social history, and previous encounter notes.  Time spent on visit including pre-visit chart review and post-visit charting and care was 20 minutes.   Migdalia Dk, am acting as Location manager for [employer], DO   I have reviewed the above documentation for accuracy and completeness, and I agree with the above. Jearld Lesch, DO

## 2020-02-21 DIAGNOSIS — F320 Major depressive disorder, single episode, mild: Secondary | ICD-10-CM | POA: Diagnosis not present

## 2020-02-24 ENCOUNTER — Encounter: Payer: Self-pay | Admitting: Primary Care

## 2020-02-24 ENCOUNTER — Other Ambulatory Visit: Payer: Self-pay

## 2020-02-24 ENCOUNTER — Ambulatory Visit: Payer: BC Managed Care – PPO | Admitting: Primary Care

## 2020-02-24 VITALS — BP 134/76 | HR 77 | Temp 97.6°F | Ht 72.0 in | Wt 285.0 lb

## 2020-02-24 DIAGNOSIS — Z0000 Encounter for general adult medical examination without abnormal findings: Secondary | ICD-10-CM | POA: Diagnosis not present

## 2020-02-24 DIAGNOSIS — B351 Tinea unguium: Secondary | ICD-10-CM | POA: Diagnosis not present

## 2020-02-24 DIAGNOSIS — Z125 Encounter for screening for malignant neoplasm of prostate: Secondary | ICD-10-CM | POA: Diagnosis not present

## 2020-02-24 DIAGNOSIS — E7800 Pure hypercholesterolemia, unspecified: Secondary | ICD-10-CM

## 2020-02-24 DIAGNOSIS — E8881 Metabolic syndrome: Secondary | ICD-10-CM

## 2020-02-24 DIAGNOSIS — G4730 Sleep apnea, unspecified: Secondary | ICD-10-CM | POA: Diagnosis not present

## 2020-02-24 DIAGNOSIS — Z113 Encounter for screening for infections with a predominantly sexual mode of transmission: Secondary | ICD-10-CM

## 2020-02-24 DIAGNOSIS — E88819 Insulin resistance, unspecified: Secondary | ICD-10-CM

## 2020-02-24 DIAGNOSIS — Z23 Encounter for immunization: Secondary | ICD-10-CM

## 2020-02-24 DIAGNOSIS — E559 Vitamin D deficiency, unspecified: Secondary | ICD-10-CM

## 2020-02-24 DIAGNOSIS — Z8042 Family history of malignant neoplasm of prostate: Secondary | ICD-10-CM

## 2020-02-24 LAB — PSA: PSA: 0.88 ng/mL (ref 0.10–4.00)

## 2020-02-24 NOTE — Assessment & Plan Note (Signed)
Due for repeat PSA

## 2020-02-24 NOTE — Assessment & Plan Note (Signed)
Compliant to weekly vitamin D. Repeat labs due in early January 2022.

## 2020-02-24 NOTE — Assessment & Plan Note (Signed)
Resolved

## 2020-02-24 NOTE — Assessment & Plan Note (Signed)
Borderline during last check. He plans to get back on track with his diet soon.  Discussed the importance of a healthy diet and regular exercise in order for weight loss, and to reduce the risk of any potential medical problems.

## 2020-02-24 NOTE — Assessment & Plan Note (Signed)
No evidence of this from recent A1C which is 5.3 as of August 2021.   Discussed the importance of a healthy diet and regular exercise in order for weight loss, and to reduce the risk of any potential medical problems.

## 2020-02-24 NOTE — Patient Instructions (Addendum)
Stop by the lab prior to leaving today. I will notify you of your results once received.   Start exercising. You should be getting 150 minutes of moderate intensity exercise weekly.  It's important to improve your diet by reducing consumption of fast food, fried food, processed snack foods, sugary drinks. Increase consumption of fresh vegetables and fruits, whole grains, water.  Ensure you are drinking 64 ounces of water daily.  It was a pleasure to see you today!   Preventive Care 90-10 Years Old, Male Preventive care refers to lifestyle choices and visits with your health care provider that can promote health and wellness. This includes:  A yearly physical exam. This is also called an annual well check.  Regular dental and eye exams.  Immunizations.  Screening for certain conditions.  Healthy lifestyle choices, such as eating a healthy diet, getting regular exercise, not using drugs or products that contain nicotine and tobacco, and limiting alcohol use. What can I expect for my preventive care visit? Physical exam Your health care provider will check:  Height and weight. These may be used to calculate body mass index (BMI), which is a measurement that tells if you are at a healthy weight.  Heart rate and blood pressure.  Your skin for abnormal spots. Counseling Your health care provider may ask you questions about:  Alcohol, tobacco, and drug use.  Emotional well-being.  Home and relationship well-being.  Sexual activity.  Eating habits.  Work and work Statistician. What immunizations do I need?  Influenza (flu) vaccine  This is recommended every year. Tetanus, diphtheria, and pertussis (Tdap) vaccine  You may need a Td booster every 10 years. Varicella (chickenpox) vaccine  You may need this vaccine if you have not already been vaccinated. Zoster (shingles) vaccine  You may need this after age 36. Measles, mumps, and rubella (MMR) vaccine  You may need  at least one dose of MMR if you were born in 1957 or later. You may also need a second dose. Pneumococcal conjugate (PCV13) vaccine  You may need this if you have certain conditions and were not previously vaccinated. Pneumococcal polysaccharide (PPSV23) vaccine  You may need one or two doses if you smoke cigarettes or if you have certain conditions. Meningococcal conjugate (MenACWY) vaccine  You may need this if you have certain conditions. Hepatitis A vaccine  You may need this if you have certain conditions or if you travel or work in places where you may be exposed to hepatitis A. Hepatitis B vaccine  You may need this if you have certain conditions or if you travel or work in places where you may be exposed to hepatitis B. Haemophilus influenzae type b (Hib) vaccine  You may need this if you have certain risk factors. Human papillomavirus (HPV) vaccine  If recommended by your health care provider, you may need three doses over 6 months. You may receive vaccines as individual doses or as more than one vaccine together in one shot (combination vaccines). Talk with your health care provider about the risks and benefits of combination vaccines. What tests do I need? Blood tests  Lipid and cholesterol levels. These may be checked every 5 years, or more frequently if you are over 27 years old.  Hepatitis C test.  Hepatitis B test. Screening  Lung cancer screening. You may have this screening every year starting at age 87 if you have a 30-pack-year history of smoking and currently smoke or have quit within the past 15 years.  Prostate cancer screening. Recommendations will vary depending on your family history and other risks.  Colorectal cancer screening. All adults should have this screening starting at age 28 and continuing until age 30. Your health care provider may recommend screening at age 29 if you are at increased risk. You will have tests every 1-10 years, depending on  your results and the type of screening test.  Diabetes screening. This is done by checking your blood sugar (glucose) after you have not eaten for a while (fasting). You may have this done every 1-3 years.  Sexually transmitted disease (STD) testing. Follow these instructions at home: Eating and drinking  Eat a diet that includes fresh fruits and vegetables, whole grains, lean protein, and low-fat dairy products.  Take vitamin and mineral supplements as recommended by your health care provider.  Do not drink alcohol if your health care provider tells you not to drink.  If you drink alcohol: ? Limit how much you have to 0-2 drinks a day. ? Be aware of how much alcohol is in your drink. In the [place] one drink equals one 12 oz bottle of beer (355 mL), one 5 oz glass of wine (148 mL), or one 1 oz glass of hard liquor (44 mL). Lifestyle  Take daily care of your teeth and gums.  Stay active. Exercise for at least 30 minutes on 5 or more days each week.  Do not use any products that contain nicotine or tobacco, such as cigarettes, e-cigarettes, and chewing tobacco. If you need help quitting, ask your health care provider.  If you are sexually active, practice safe sex. Use a condom or other form of protection to prevent STIs (sexually transmitted infections).  Talk with your health care provider about taking a low-dose aspirin every day starting at age 33. What's next?  Go to your health care provider once a year for a well check visit.  Ask your health care provider how often you should have your eyes and teeth checked.  Stay up to date on all vaccines. This information is not intended to replace advice given to you by your health care provider. Make sure you discuss any questions you have with your health care provider. Document Revised: 02/25/2018 Document Reviewed: 02/25/2018 Elsevier Patient Education  [place].      Influenza (Flu) Vaccine (Inactivated or  Recombinant): What You Need to Know 1. Why get vaccinated? Influenza vaccine can prevent influenza (flu). Flu is a contagious disease that spreads around the [place] every year, usually between October and May. Anyone can get the flu, but it is more dangerous for some people. Infants and young children, people 13 years of age and older, pregnant women, and people with certain health conditions or a weakened immune system are at greatest risk of flu complications. Pneumonia, bronchitis, sinus infections and ear infections are examples of flu-related complications. If you have a medical condition, such as heart disease, cancer or diabetes, flu can make it worse. Flu can cause fever and chills, sore throat, muscle aches, fatigue, cough, headache, and runny or stuffy nose. Some people may have vomiting and diarrhea, though this is more common in children than adults. Each year thousands of people in the [place] die from flu, and many more are hospitalized. Flu vaccine prevents millions of illnesses and flu-related visits to the doctor each year. 2. Influenza vaccine CDC recommends everyone 22 months of age and older get vaccinated every flu season. Children 6 months through 8 years  of age may need 2 doses during a single flu season. Everyone else needs only 1 dose each flu season. It takes about 2 weeks for protection to develop after vaccination. There are many flu viruses, and they are always changing. Each year a new flu vaccine is made to protect against three or four viruses that are likely to cause disease in the upcoming flu season. Even when the vaccine doesn't exactly match these viruses, it may still provide some protection. Influenza vaccine does not cause flu. Influenza vaccine may be given at the same time as other vaccines. 3. Talk with your health care provider Tell your vaccine provider if the person getting the vaccine:  Has had an allergic reaction after a previous dose of  influenza vaccine, or has any severe, life-threatening allergies.  Has ever had Guillain-Barr Syndrome (also called GBS). In some cases, your health care provider may decide to postpone influenza vaccination to a future visit. People with minor illnesses, such as a cold, may be vaccinated. People who are moderately or severely ill should usually wait until they recover before getting influenza vaccine. Your health care provider can give you more information. 4. Risks of a vaccine reaction  Soreness, redness, and swelling where shot is given, fever, muscle aches, and headache can happen after influenza vaccine.  There may be a very small increased risk of Guillain-Barr Syndrome (GBS) after inactivated influenza vaccine (the flu shot). Young children who get the flu shot along with pneumococcal vaccine (PCV13), and/or DTaP vaccine at the same time might be slightly more likely to have a seizure caused by fever. Tell your health care provider if a child who is getting flu vaccine has ever had a seizure. People sometimes faint after medical procedures, including vaccination. Tell your provider if you feel dizzy or have vision changes or ringing in the ears. As with any medicine, there is a very remote chance of a vaccine causing a severe allergic reaction, other serious injury, or death. 5. What if there is a serious problem? An allergic reaction could occur after the vaccinated person leaves the clinic. If you see signs of a severe allergic reaction (hives, swelling of the face and throat, difficulty breathing, a fast heartbeat, dizziness, or weakness), call 9-1-1 and get the person to the nearest hospital. For other signs that concern you, call your health care provider. Adverse reactions should be reported to the Vaccine Adverse Event Reporting System (VAERS). Your health care provider will usually file this report, or you can do it yourself. Visit the VAERS website at www.vaers.SamedayNews.es or call  708 040 9650.VAERS is only for reporting reactions, and VAERS staff do not give medical advice. 6. The National Vaccine Injury Compensation Program The [employer] Vaccine Injury Compensation Program (VICP) is a federal program that was created to compensate people who may have been injured by certain vaccines. Visit the VICP website at GoldCloset.com.ee or call 204-501-9791 to learn about the program and about filing a claim. There is a time limit to file a claim for compensation. 7. How can I learn more?  Ask your healthcare provider.  Call your local or state health department.  Contact the Centers for Disease Control and Prevention (CDC): ? Call (587)339-8479 (1-800-CDC-INFO) or ? Visit CDC's https://gibson.com/ Vaccine Information Statement (Interim) Inactivated Influenza Vaccine (10/29/2017) This information is not intended to replace advice given to you by your health care provider. Make sure you discuss any questions you have with your health care provider. Document Revised: 06/22/2018 Document Reviewed: 11/02/2017  Elsevier Patient Education  [employer].

## 2020-02-24 NOTE — Progress Notes (Signed)
Subjective:    Patient ID: Richard [name], male    DOB: [birth year], 49 y.o.   MRN: [id number]  HPI  This visit occurred during the SARS-CoV-2 public health emergency.  Safety protocols were in place, including screening questions prior to the visit, additional usage of staff PPE, and extensive cleaning of exam room while observing appropriate contact time as indicated for disinfecting solutions.   Richard [name] is a 49 year old male who presents today for complete physical.  He would like to undergo screening for STD's. He had unprotected intercourse two weeks ago and would like to be screened. He is asymptomatic.   Immunizations: -Tetanus: Completed in 2015 -Influenza: Completed this season  -Shingles: Never completed  -Covid-19: Completed series  Diet: He endorses a poor diet as he is traveling. Exercise: No regular exercise as of recent.   Eye exam: Completes annually  Dental exam: Completes semi-annually   Colonoscopy: Completed in 2021, due again in 2024 PSA: Due Hep C Screen: Negative   BP Readings from Last 3 Encounters:  02/24/20 134/76  02/16/20 130/81  01/23/20 136/80    Wt Readings from Last 3 Encounters:  02/24/20 285 lb (129.3 kg)  02/16/20 276 lb (125.2 kg)  01/23/20 278 lb (126.1 kg)     Review of Systems  Constitutional: Negative for unexpected weight change.  HENT: Negative for rhinorrhea.   Eyes: Negative for visual disturbance.  Respiratory: Negative for cough and shortness of breath.   Cardiovascular: Negative for chest pain.  Gastrointestinal: Negative for constipation and diarrhea.  Genitourinary: Negative for difficulty urinating.  Musculoskeletal: Negative for arthralgias and myalgias.  Skin: Negative for rash.  Allergic/Immunologic: Negative for environmental allergies.  Neurological: Negative for dizziness, numbness and headaches.  Psychiatric/Behavioral: Positive for sleep disturbance.       Past Medical History:  Diagnosis Date  . Back  pain   . Depression   . Diverticulosis   . Gallbladder problem   . Lactose intolerance   . Medical history non-contributory   . Overweight      Social History   Socioeconomic History  . Marital status: Divorced    Spouse name: Not on file  . Number of children: Not on file  . Years of education: Not on file  . Highest education level: Not on file  Occupational History  . Occupation: Automotive engineer, Licensed conveyancer  Tobacco Use  . Smoking status: Never Smoker  . Smokeless tobacco: Never Used  Vaping Use  . Vaping Use: Never used  Substance and Sexual Activity  . Alcohol use: No    Alcohol/week: 0.0 standard drinks  . Drug use: No  . Sexual activity: Yes    Partners: Female  Other Topics Concern  . Not on file  Social History Narrative  . Not on file   Social Determinants of Health   Financial Resource Strain: Not on file  Food Insecurity: Not on file  Transportation Needs: Not on file  Physical Activity: Not on file  Stress: Not on file  Social Connections: Not on file  Intimate Partner Violence: Not on file    Past Surgical History:  Procedure Laterality Date  . 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 CHOLECYSTECTOMY  1992  . COSMETIC SURGERY Left    lipoma removal, arm/ forehead  . NASAL SEPTOPLASTY W/ TURBINOPLASTY Bilateral 10/10/2015   Procedure: NASAL SEPTOPLASTY WITH BILATERAL TURBINATE REDUCTION;  Surgeon: [name], MD;  Location: [facility];  Service: ENT;  Laterality:  Bilateral;  . UVULECTOMY N/A 10/10/2015   Procedure: Myrtis Ser ASSISTED;  Surgeon: [name], MD;  Location: [facility] OR;  Service: ENT;  Laterality: N/A;    Family History  Problem Relation Age of Onset  . Diabetes Mother   . Cancer Mother 62       breast, skin,lymphoma, deceased  . Breast cancer Mother   . Depression Mother   . Obesity Mother   . Cancer Father        prostate, skin  . Prostate cancer Father   . Sleep apnea Father   . Depression Sister   .  Cancer Sister        sarcoidosis  . Depression Brother   . Esophageal cancer Neg Hx   . Rectal cancer Neg Hx   . Stomach cancer Neg Hx   . Colon cancer Neg Hx   . Colon polyps Neg Hx     Allergies  Allergen Reactions  . Ppd [Tuberculin Purified Protein Derivative] Other (See Comments)    + PPD NEG CXR 2/09    Current Outpatient Medications on File Prior to Visit  Medication Sig Dispense Refill  . FIBER ADULT GUMMIES PO Take by mouth.    . Multiple Vitamin (MULTIVITAMIN) capsule Take 1 capsule by mouth daily.    . Vitamin D, Ergocalciferol, (DRISDOL) 1.25 MG (50000 UNIT) CAPS capsule Take 1 capsule (50,000 Units total) by mouth every 7 (seven) days. 4 capsule 0   No current facility-administered medications on file prior to visit.    BP 134/76   Pulse 77   Temp 97.6 F (36.4 C) (Temporal)   Ht 6' (1.829 m)   Wt 285 lb (129.3 kg)   SpO2 98%   BMI 38.65 kg/m    Objective:   Physical Exam Constitutional:      Appearance: He is well-nourished.  HENT:     Right Ear: Tympanic membrane and ear canal normal.     Left Ear: Tympanic membrane and ear canal normal.     Mouth/Throat:     Mouth: Oropharynx is clear and moist.  Eyes:     Extraocular Movements: EOM normal.     Pupils: Pupils are equal, round, and reactive to light.  Cardiovascular:     Rate and Rhythm: Normal rate and regular rhythm.  Pulmonary:     Effort: Pulmonary effort is normal.     Breath sounds: Normal breath sounds.  Abdominal:     General: Bowel sounds are normal.     Palpations: Abdomen is soft.     Tenderness: There is no abdominal tenderness.  Musculoskeletal:        General: Normal range of motion.     Cervical back: Neck supple.  Skin:    General: Skin is warm and dry.  Neurological:     Mental Status: He is alert and oriented to person, place, and time.     Cranial Nerves: No cranial nerve deficit.     Deep Tendon Reflexes:     Reflex Scores:      Patellar reflexes are 2+ on the  right side and 2+ on the left side. Psychiatric:        Mood and Affect: Mood and affect and mood normal.            Assessment & Plan:

## 2020-02-24 NOTE — Assessment & Plan Note (Signed)
Due for Shingrix, declines today as he will be traveling for three weeks soon. Other immunizations UTD.  PSA due and pending. STD screening pending.  Colonoscopy UTD, due in 2024.  Discussed the importance of a healthy diet and regular exercise in order for weight loss, and to reduce the risk of any potential medical problems.  Exam today unremarkable. Labs reviewed.

## 2020-02-24 NOTE — Assessment & Plan Note (Signed)
Mild case years ago, has palate surgery. He denies sleep disturbance.

## 2020-02-27 ENCOUNTER — Encounter (INDEPENDENT_AMBULATORY_CARE_PROVIDER_SITE_OTHER): Payer: Self-pay | Admitting: Bariatrics

## 2020-02-29 DIAGNOSIS — F320 Major depressive disorder, single episode, mild: Secondary | ICD-10-CM | POA: Diagnosis not present

## 2020-02-29 LAB — RPR: RPR Ser Ql: NONREACTIVE

## 2020-02-29 LAB — C. TRACHOMATIS/N. GONORRHOEAE RNA
C. trachomatis RNA, TMA: NOT DETECTED
N. gonorrhoeae RNA, TMA: NOT DETECTED

## 2020-02-29 LAB — HSV(HERPES SIMPLEX VRS) I + II AB-IGG
HAV 1 IGG,TYPE SPECIFIC AB: <0.9 index
HSV 2 IGG,TYPE SPECIFIC AB: <0.9 index

## 2020-02-29 LAB — HSV 1/2 AB (IGM), IFA W/RFLX TITER
HSV 1 IgM Screen: NEGATIVE
HSV 2 IgM Screen: NEGATIVE

## 2020-02-29 LAB — HEPATITIS C ANTIBODY
Hepatitis C Ab: NONREACTIVE
SIGNAL TO CUT-OFF: 0.01 (ref <1.00)

## 2020-02-29 LAB — TRICHOMONAS VAGINALIS RNA, QL,MALES: Trichomonas vaginalis RNA: NOT DETECTED

## 2020-02-29 LAB — HIV ANTIBODY (ROUTINE TESTING W REFLEX): HIV 1&2 Ab, 4th Generation: NONREACTIVE

## 2020-03-07 DIAGNOSIS — F320 Major depressive disorder, single episode, mild: Secondary | ICD-10-CM | POA: Diagnosis not present

## 2020-03-20 ENCOUNTER — Telehealth (INDEPENDENT_AMBULATORY_CARE_PROVIDER_SITE_OTHER): Payer: BC Managed Care – PPO | Admitting: Bariatrics

## 2020-03-20 ENCOUNTER — Encounter (INDEPENDENT_AMBULATORY_CARE_PROVIDER_SITE_OTHER): Payer: Self-pay | Admitting: Bariatrics

## 2020-03-20 DIAGNOSIS — R059 Cough, unspecified: Secondary | ICD-10-CM

## 2020-03-20 DIAGNOSIS — E559 Vitamin D deficiency, unspecified: Secondary | ICD-10-CM

## 2020-03-20 DIAGNOSIS — Z6837 Body mass index (BMI) 37.0-37.9, adult: Secondary | ICD-10-CM

## 2020-03-20 MED ORDER — VITAMIN D (ERGOCALCIFEROL) 1.25 MG (50000 UNIT) PO CAPS: 50000.0000 [IU] | ORAL_CAPSULE | ORAL | 0 refills | Status: DC

## 2020-03-20 MED ORDER — BENZONATATE 100 MG PO CAPS: 100.0000 mg | ORAL_CAPSULE | Freq: Two times a day (BID) | ORAL | 0 refills | Status: DC | PRN

## 2020-03-21 ENCOUNTER — Encounter (INDEPENDENT_AMBULATORY_CARE_PROVIDER_SITE_OTHER): Payer: Self-pay | Admitting: Bariatrics

## 2020-03-21 DIAGNOSIS — F320 Major depressive disorder, single episode, mild: Secondary | ICD-10-CM | POA: Diagnosis not present

## 2020-03-21 NOTE — Progress Notes (Signed)
TeleHealth Visit:  Due to the COVID-19 pandemic, this visit was completed with telemedicine (audio/video) technology to reduce patient and provider exposure as well as to preserve personal protective equipment.   Richard [name] has verbally consented to this TeleHealth visit. The patient is located at home, the provider is located at the [facility] office. The participants in this visit include the listed provider and patient. The visit was conducted today via telephone after attempting video visit (system down).  Richard [name] was unable to use realtime audiovisual technology today and the telehealth visit was conducted via telephone.  Chief Complaint: OBESITY Leaf is here to discuss his progress with his obesity treatment plan along with follow-up of his obesity related diagnoses. Richard [name] is on practicing portion control and making smarter food choices, such as increasing vegetables and decreasing simple carbohydrates and states he is following his eating plan approximately 0% of the time. Kamdon states he is not exercising at this time.  Today's visit was #: 7 Starting weight: 287 lbs Starting date: 11/09/2019  Interim History: Clemon has a cough and did not want to come in.  He has gained a few pounds.  He has been traveling and it has been hard to find foods on plan.  He is doing well with his water.  Subjective:   1. Vitamin D deficiency Richard [name]'s Vitamin D level was 24.1 on 11/09/2019. He is currently taking prescription vitamin D 50,000 IU each week. He denies nausea, vomiting or muscle weakness.  2. Cough Richard [name] has had a cough recently, so we performed a telehealth visit today.  Assessment/Plan:   1. Vitamin D deficiency Low Vitamin D level contributes to fatigue and are associated with obesity, breast, and colon cancer. He agrees to continue to take prescription Vitamin D @50 ,000 IU every week and will follow-up for routine testing of Vitamin D, at least 2-3 times per year to avoid  over-replacement.  -Refill Vitamin D, Ergocalciferol, (DRISDOL) 1.25 MG (50000 UNIT) CAPS capsule; Take 1 capsule (50,000 Units total) by mouth every 7 (seven) days.  Dispense: 4 capsule; Refill: 0  2. Cough Will send in Tessalon Pearles 100 mg capsules 1 capsule twice daily for cough, #20, 0 refill, today.  3. Class 2 severe obesity with serious comorbidity and body mass index (BMI) of 37.0 to 37.9 in adult, unspecified obesity type (HCC)  Richard [name] is currently in the action stage of change. As such, his goal is to continue with weight loss efforts. He has agreed to the Category 4 Plan.   He will work on meal planning and intentional eating.  Exercise goals: All adults should avoid inactivity. Some physical activity is better than none, and adults who participate in any amount of physical activity gain some health benefits.  Behavioral modification strategies: increasing lean protein intake, decreasing simple carbohydrates, increasing vegetables, increasing water intake, decreasing eating out, no skipping meals, meal planning and cooking strategies, keeping healthy foods in the home and planning for success.  Richard [name] has agreed to follow-up with our clinic in 2-3 weeks. He was informed of the importance of frequent follow-up visits to maximize his success with intensive lifestyle modifications for his multiple health conditions.  Objective:   VITALS: Per patient if applicable, see vitals. GENERAL: Alert and in no acute distress. CARDIOPULMONARY: No increased WOB. Speaking in clear sentences.  PSYCH: Pleasant and cooperative. Speech normal rate and rhythm. Affect is appropriate. Insight and judgement are appropriate. Attention is focused, linear, and appropriate.  NEURO: Oriented as arrived  to appointment on time with no prompting.   Lab Results  Component Value Date   CREATININE 1.20 11/09/2019   BUN 17 11/09/2019   NA 143 11/09/2019   K 4.7 11/09/2019   CL 104 11/09/2019   CO2 26  11/09/2019   Lab Results  Component Value Date   ALT 43 11/09/2019   AST 25 11/09/2019   ALKPHOS 77 11/09/2019   BILITOT 0.6 11/09/2019   Lab Results  Component Value Date   HGBA1C 5.3 11/09/2019   HGBA1C 5.2 01/18/2019   HGBA1C 5.2 12/15/2017   HGBA1C 5.6 07/07/2013   Lab Results  Component Value Date   INSULIN 17.4 11/09/2019   Lab Results  Component Value Date   TSH 2.630 11/09/2019   Lab Results  Component Value Date   CHOL 181 11/09/2019   HDL 34 (L) 11/09/2019   LDLCALC 117 (H) 11/09/2019   LDLDIRECT 70.0 12/15/2017   TRIG 171 (H) 11/09/2019   CHOLHDL 5 01/11/2019   Lab Results  Component Value Date   WBC 7.0 01/11/2019   HGB 15.0 01/11/2019   HCT 43.0 01/11/2019   MCV 87.3 01/11/2019   PLT 215.0 01/11/2019   Attestation Statements:   Reviewed by clinician on day of visit: allergies, medications, problem list, medical history, surgical history, family history, social history, and previous encounter notes.  Time spent on visit including pre-visit chart review and post-visit charting and care was 20 minutes.   I, Water quality scientist, CMA, am acting as Location manager for [employer], DO  I have reviewed the above documentation for accuracy and completeness, and I agree with the above. Jearld Lesch, DO

## 2020-03-25 ENCOUNTER — Other Ambulatory Visit: Payer: Self-pay

## 2020-03-25 ENCOUNTER — Ambulatory Visit (HOSPITAL_COMMUNITY)
Admission: EM | Admit: 2020-03-25 | Discharge: 2020-03-25 | Disposition: A | Discharge status: Home / Self Care | Payer: BC Managed Care – PPO | Attending: Family Medicine | Admitting: Family Medicine

## 2020-03-25 ENCOUNTER — Ambulatory Visit (INDEPENDENT_AMBULATORY_CARE_PROVIDER_SITE_OTHER): Payer: BC Managed Care – PPO

## 2020-03-25 ENCOUNTER — Encounter (HOSPITAL_COMMUNITY): Payer: Self-pay | Admitting: Emergency Medicine

## 2020-03-25 DIAGNOSIS — J209 Acute bronchitis, unspecified: Secondary | ICD-10-CM

## 2020-03-25 DIAGNOSIS — R059 Cough, unspecified: Secondary | ICD-10-CM

## 2020-03-25 DIAGNOSIS — R52 Pain, unspecified: Secondary | ICD-10-CM | POA: Diagnosis not present

## 2020-03-25 MED ORDER — PROMETHAZINE-DM 6.25-15 MG/5ML PO SYRP: 5.0000 mL | ORAL_SOLUTION | Freq: Four times a day (QID) | ORAL | 0 refills | Status: DC | PRN

## 2020-03-25 MED ORDER — ALBUTEROL SULFATE HFA 108 (90 BASE) MCG/ACT IN AERS: 1.0000 | INHALATION_SPRAY | Freq: Four times a day (QID) | RESPIRATORY_TRACT | 0 refills | Status: DC | PRN

## 2020-03-25 MED ORDER — PREDNISONE 20 MG PO TABS: 40.0000 mg | ORAL_TABLET | Freq: Every day | ORAL | 0 refills | Status: DC

## 2020-03-25 NOTE — ED Triage Notes (Signed)
Pt presents with cough xs 1-2 weeks. States was tested Negative for COVID yesterday. Had teledoc appt and prescribed tessalon pearls with no relief. Using OTC cough medications with no relief.

## 2020-03-25 NOTE — ED Provider Notes (Signed)
[place]    CSN: [id number] Arrival date & time: 03/25/20  1047      History   Chief Complaint Chief Complaint  Patient presents with  . Cough    HPI Richard [name] is a 50 y.o. male.   Here today with almost 2 weeks of hacking productive cough, chest and abdominal muscle soreness. Denies fever, chills, CP, SOB, N/V/D, nasal congestion, sore throat. So far taking dayquil, nyquil, mucinex, lozenges, and now tessalon given by [facility] without any relief. Sxs seem to be worsening the past few days. No known sick contacts, was COVID neg when seen by [facility] a few days ago. UTD on vaccines.      Past Medical History:  Diagnosis Date  . Back pain   . Depression   . Diverticulosis   . Gallbladder problem   . Lactose intolerance   . Medical history non-contributory   . Overweight     Patient Active Problem List   Diagnosis Date Noted  . Insulin resistance 11/10/2019  . Family history of prostate cancer in father 01/18/2019  . Vasectomy evaluation 01/18/2019  . Obesity (BMI 30.0-34.9) 01/15/2018  . Chronic foot pain 01/15/2018  . Preventative health care 12/11/2015  . Onychomycosis 10/18/2015  . Sleep apnea 10/10/2015  . Tinea pedis of both feet 07/29/2015  . Xerosis of skin 07/29/2015  . Toenail deformity 06/26/2015  . Lipoma 06/26/2015  . Chronic nasal congestion 06/26/2015  . Vitamin D deficiency   . Elevated cholesterol     Past Surgical History:  Procedure Laterality Date  . 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 CHOLECYSTECTOMY  1992  . COSMETIC SURGERY Left    lipoma removal, arm/ forehead  . NASAL SEPTOPLASTY W/ TURBINOPLASTY Bilateral 10/10/2015   Procedure: NASAL SEPTOPLASTY WITH BILATERAL TURBINATE REDUCTION;  Surgeon: [name], MD;  Location: [facility];  Service: ENT;  Laterality: Bilateral;  . UVULECTOMY N/A 10/10/2015   Procedure: Myrtis Ser ASSISTED;  Surgeon: [name], MD;  Location: [facility];  Service: ENT;  Laterality: N/A;        Home Medications    Prior to Admission medications   Medication Sig Start Date End Date Taking? Authorizing Provider  albuterol (VENTOLIN HFA) 108 (90 Base) MCG/ACT inhaler Inhale 1-2 puffs into the lungs every 6 (six) hours as needed for wheezing or shortness of breath. 03/25/20  Yes Volney American, PA-C  predniSONE (DELTASONE) 20 MG tablet Take 2 tablets (40 mg total) by mouth daily with breakfast. 03/25/20  Yes Volney American, PA-C  promethazine-dextromethorphan (PROMETHAZINE-DM) 6.25-15 MG/5ML syrup Take 5 mLs by mouth 4 (four) times daily as needed for cough. 03/25/20  Yes Volney American, PA-C  benzonatate (TESSALON) 100 MG capsule Take 1 capsule (100 mg total) by mouth 2 (two) times daily as needed for cough. 03/20/20   Jearld Lesch A, DO  FIBER ADULT GUMMIES PO Take by mouth.    [provider]  Multiple Vitamin (MULTIVITAMIN) capsule Take 1 capsule by mouth daily.    [provider]  Vitamin D, Ergocalciferol, (DRISDOL) 1.25 MG (50000 UNIT) CAPS capsule Take 1 capsule (50,000 Units total) by mouth every 7 (seven) days. 03/20/20   Georgia Lopes, DO    Family History Family History  Problem Relation Age of Onset  . Diabetes Mother   . Cancer Mother 24       breast, skin,lymphoma, deceased  . Breast cancer Mother   . Depression Mother   .  Obesity Mother   . Cancer Father        prostate, skin  . Prostate cancer Father   . Sleep apnea Father   . Depression Sister   . Cancer Sister        sarcoidosis  . Depression Brother   . Esophageal cancer Neg Hx   . Rectal cancer Neg Hx   . Stomach cancer Neg Hx   . Colon cancer Neg Hx   . Colon polyps Neg Hx     Social History Social History   Tobacco Use  . Smoking status: Never Smoker  . Smokeless tobacco: Never Used  Vaping Use  . Vaping Use: Never used  Substance Use Topics  . Alcohol use: No    Alcohol/week: 0.0 standard drinks  . Drug use: No     Allergies   Ppd  [tuberculin purified protein derivative]   Review of Systems Review of Systems PER HPI   Physical Exam Triage Vital Signs ED Triage Vitals  Enc Vitals Group     BP 03/25/20 1209 (!) 143/89     Pulse Rate 03/25/20 1209 79     Resp 03/25/20 1209 18     Temp 03/25/20 1209 98.2 F (36.8 C)     Temp Source 03/25/20 1209 Oral     SpO2 03/25/20 1209 96 %     Weight --      Height --      Head Circumference --      Peak Flow --      Pain Score 03/25/20 1208 4     Pain Loc --      Pain Edu? --      Excl. in [place]? --    No data found.  Updated Vital Signs BP (!) 143/89 (BP Location: Left Arm)   Pulse 79   Temp 98.2 F (36.8 C) (Oral)   Resp 18   SpO2 96%   Visual Acuity Right Eye Distance:   Left Eye Distance:   Bilateral Distance:    Right Eye Near:   Left Eye Near:    Bilateral Near:     Physical Exam Vitals and nursing note reviewed.  Constitutional:      Appearance: Normal appearance.  HENT:     Head: Atraumatic.     Right Ear: Tympanic membrane normal.     Left Ear: Tympanic membrane normal.     Nose: Nose normal.     Mouth/Throat:     Mouth: Mucous membranes are moist.     Pharynx: Posterior oropharyngeal erythema present.  Eyes:     Extraocular Movements: Extraocular movements intact.     Conjunctiva/sclera: Conjunctivae normal.  Cardiovascular:     Rate and Rhythm: Normal rate and regular rhythm.     Heart sounds: Normal heart sounds.  Pulmonary:     Effort: Pulmonary effort is normal. No respiratory distress.     Breath sounds: Wheezing (diffuse, moderate) present. No rales.  Abdominal:     General: Bowel sounds are normal.     Palpations: Abdomen is soft.  Musculoskeletal:        General: Normal range of motion.     Cervical back: Normal range of motion and neck supple.  Skin:    General: Skin is warm and dry.  Neurological:     General: No focal deficit present.     Mental Status: He is oriented to person, place, and time.  Psychiatric:         Mood and  Affect: Mood normal.        Thought Content: Thought content normal.        Judgment: Judgment normal.      UC Treatments / Results  Labs (all labs ordered are listed, but only abnormal results are displayed) Labs Reviewed - No data to display  EKG   Radiology DG Chest 2 View  Result Date: 03/25/2020 CLINICAL DATA:  Two weeks of worsening productive cough.  Soreness. EXAM: CHEST - 2 VIEW COMPARISON:  None. FINDINGS: The heart size and mediastinal contours are within normal limits. Both lungs are clear. The visualized skeletal structures are unremarkable. IMPRESSION: No active cardiopulmonary disease. Electronically Signed   By: Dorise Bullion III M.D   On: 03/25/2020 12:51    Procedures Procedures (including critical care time)  Medications Ordered in UC Medications - No data to display  Initial Impression / Assessment and Plan / UC Course  I have reviewed the triage vital signs and the nursing notes.  Pertinent labs & imaging results that were available during my care of the patient were reviewed by me and considered in my medical decision making (see chart for details).     Vitals reassuring, CXR today without acute abnormality. WIll treat for bronchitis with prednisone, albuterol and phenergan DM for cough. Mucinex, continued OTC remedies reviewed. Return for worsening sxs.   Final Clinical Impressions(s) / UC Diagnoses   Final diagnoses:  Acute bronchitis, unspecified organism   Discharge Instructions   None    ED Prescriptions    Medication Sig Dispense Auth. Provider   predniSONE (DELTASONE) 20 MG tablet Take 2 tablets (40 mg total) by mouth daily with breakfast. 10 tablet Volney American, PA-C   promethazine-dextromethorphan (PROMETHAZINE-DM) 6.25-15 MG/5ML syrup Take 5 mLs by mouth 4 (four) times daily as needed for cough. 100 mL Volney American, PA-C   albuterol (VENTOLIN HFA) 108 (90 Base) MCG/ACT inhaler Inhale 1-2 puffs into the  lungs every 6 (six) hours as needed for wheezing or shortness of breath. 18 g Volney American, [place]     PDMP not reviewed this encounter.   Volney American, [place] 03/25/20 1327

## 2020-03-28 DIAGNOSIS — F320 Major depressive disorder, single episode, mild: Secondary | ICD-10-CM | POA: Diagnosis not present

## 2020-03-29 NOTE — Telephone Encounter (Signed)
Please schedule for Saturday clinic

## 2020-03-30 ENCOUNTER — Encounter: Payer: Self-pay | Admitting: Primary Care

## 2020-03-30 ENCOUNTER — Telehealth (INDEPENDENT_AMBULATORY_CARE_PROVIDER_SITE_OTHER): Payer: BC Managed Care – PPO | Admitting: Primary Care

## 2020-03-30 DIAGNOSIS — J22 Unspecified acute lower respiratory infection: Secondary | ICD-10-CM | POA: Diagnosis not present

## 2020-03-30 MED ORDER — PROMETHAZINE-DM 6.25-15 MG/5ML PO SYRP
5.0000 mL | ORAL_SOLUTION | Freq: Four times a day (QID) | ORAL | 0 refills | Status: DC | PRN
Start: 2020-03-30 — End: 2022-01-21

## 2020-03-30 MED ORDER — AMOXICILLIN-POT CLAVULANATE 875-125 MG PO TABS: 1.0000 | ORAL_TABLET | Freq: Two times a day (BID) | ORAL | 0 refills | Status: DC

## 2020-03-30 NOTE — Patient Instructions (Signed)
Start Augmentin antibiotics for the infection Take 1 tablet by mouth twice daily for 7 days.  Please update me in a few days! Allie Bossier, NP-C

## 2020-03-30 NOTE — Progress Notes (Signed)
Subjective:    Patient ID: Richard [name], male    DOB: 10/22/1970, 50 y.o.   MRN: [id number]  HPI  Virtual Visit via Video Note  I connected with Richard [name] on 03/30/20 at 12:20 PM EST by a video enabled telemedicine application and verified that I am speaking with the correct person using two identifiers.  Location: Patient: Writer: Office Participants: patient and myself   I discussed the limitations of evaluation and management by telemedicine and the availability of in person appointments. The patient expressed understanding and agreed to proceed.  History of Present Illness:  Richard [name] is a 50 year old male with a history of sleep apnea, chronic nasal congestion who presents today with a chief complaint of cough.  He was evaluated at Urgent Care on 03/25/20 with 2 week history of cough, chest and abdominal muscle soreness. He'd been taking Dayquil, Nyquil, Mucinex, Lozenges, tessalon perles (provided by a telehealth doctor a few days prior) without improvement. During his visit he underwent chest xray which was negative. He was treated for bronchitis with prednisone, albuterol, promethazine-DM cough suppressant.   Today he's only slightly better, but is struggling with continued symptoms. He's nearly finished with his medications prescribed at [facility] and is worried about what may happen once he completes his regimen.  He's experiencing a lot of right lower chest all tenderness and pain from coughing. He cannot catch his breath, throat is sore. This morning his cough was productive, but mostly non productive. The cough suppressant and inhaler helps some.   He's been tested for Covid-19 13 times, all negative. He denies loss of taste/smell, diarrhea, fevers.   Observations/Objective:  Alert and oriented. Appears well, not sickly. No distress. Speaking in complete sentences. Congested cough noted during visit.  Assessment and Plan:  Acute upper and now lower URI symptoms x 3  weeks. He does not have Covid-19. Very little and temporary improvement with OTC and prescribed medications, xray reviewed.  Given duration of symptoms, coupled with symptoms and little improvement, will treat with Augmentin course for presumed bacterial involvement. He agrees.  Rx for Augmentin course sent to pharmacy. Refill provided for promethazine DM. He will update in a few days.  Follow Up Instructions:  Start Augmentin antibiotics for the infection Take 1 tablet by mouth twice daily for 7 days.  Please update me in a few days! Allie Bossier, NP-C    I discussed the assessment and treatment plan with the patient. The patient was provided an opportunity to ask questions and all were answered. The patient agreed with the plan and demonstrated an understanding of the instructions.   The patient was advised to call back or seek an in-person evaluation if the symptoms worsen or if the condition fails to improve as anticipated.    Richard Koch, NP    Review of Systems  Constitutional: Positive for fatigue. Negative for chills and fever.  HENT: Positive for congestion.   Respiratory: Positive for cough and chest tightness. Negative for shortness of breath.   Gastrointestinal: Negative for diarrhea.  Musculoskeletal: Positive for myalgias.       Past Medical History:  Diagnosis Date  . Back pain   . Depression   . Diverticulosis   . Gallbladder problem   . Lactose intolerance   . Medical history non-contributory   . Overweight      Social History   Socioeconomic History  . Marital status: Divorced    Spouse name: Not on file  .  Number of children: Not on file  . Years of education: Not on file  . Highest education level: Not on file  Occupational History  . Occupation: Automotive engineer, Licensed conveyancer  Tobacco Use  . Smoking status: Never Smoker  . Smokeless tobacco: Never Used  Vaping Use  . Vaping Use: Never used  Substance and Sexual  Activity  . Alcohol use: No    Alcohol/week: 0.0 standard drinks  . Drug use: No  . Sexual activity: Yes    Partners: Female  Other Topics Concern  . Not on file  Social History Narrative  . Not on file   Social Determinants of Health   Financial Resource Strain: Not on file  Food Insecurity: Not on file  Transportation Needs: Not on file  Physical Activity: Not on file  Stress: Not on file  Social Connections: Not on file  Intimate Partner Violence: Not on file    Past Surgical History:  Procedure Laterality Date  . 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 CHOLECYSTECTOMY  1992  . COSMETIC SURGERY Left    lipoma removal, arm/ forehead  . NASAL SEPTOPLASTY W/ TURBINOPLASTY Bilateral 10/10/2015   Procedure: NASAL SEPTOPLASTY WITH BILATERAL TURBINATE REDUCTION;  Surgeon: [name], MD;  Location: [facility];  Service: ENT;  Laterality: Bilateral;  . UVULECTOMY N/A 10/10/2015   Procedure: Myrtis Ser ASSISTED;  Surgeon: [name], MD;  [facility];  Service: ENT;  Laterality: N/A;    Family History  Problem Relation Age of Onset  . Diabetes Mother   . Cancer Mother 47       breast, skin,lymphoma, deceased  . Breast cancer Mother   . Depression Mother   . Obesity Mother   . Cancer Father        prostate, skin  . Prostate cancer Father   . Sleep apnea Father   . Depression Sister   . Cancer Sister        sarcoidosis  . Depression Brother   . Esophageal cancer Neg Hx   . Rectal cancer Neg Hx   . Stomach cancer Neg Hx   . Colon cancer Neg Hx   . Colon polyps Neg Hx     Allergies  Allergen Reactions  . Ppd [Tuberculin Purified Protein Derivative] Other (See Comments)    + PPD NEG CXR 2/09    Current Outpatient Medications on File Prior to Visit  Medication Sig Dispense Refill  . albuterol (VENTOLIN HFA) 108 (90 Base) MCG/ACT inhaler Inhale 1-2 puffs into the lungs every 6 (six) hours as needed for wheezing or shortness of breath. 18 g 0  . FIBER ADULT  GUMMIES PO Take by mouth.    . Multiple Vitamin (MULTIVITAMIN) capsule Take 1 capsule by mouth daily.    . promethazine-dextromethorphan (PROMETHAZINE-DM) 6.25-15 MG/5ML syrup Take 5 mLs by mouth 4 (four) times daily as needed for cough. 100 mL 0  . Vitamin D, Ergocalciferol, (DRISDOL) 1.25 MG (50000 UNIT) CAPS capsule Take 1 capsule (50,000 Units total) by mouth every 7 (seven) days. 4 capsule 0   No current facility-administered medications on file prior to visit.    Ht 6' (1.829 m)   Wt 258 lb (117 kg)   BMI 34.99 kg/m    Objective:   Physical Exam Constitutional:      General: He is not in acute distress. Pulmonary:     Effort: Pulmonary effort is normal.     Comments: Congested cough noted during  visit Neurological:     Mental Status: He is alert and oriented to person, place, and time.  Psychiatric:        Mood and Affect: Mood normal.            Assessment & Plan:

## 2020-03-30 NOTE — Assessment & Plan Note (Signed)
Acute upper and now lower URI symptoms x 3 weeks. He does not have Covid-19. Very little and temporary improvement with OTC and prescribed medications, xray reviewed.  Given duration of symptoms, coupled with symptoms and little improvement, will treat with Augmentin course for presumed bacterial involvement. He agrees.  Rx for Augmentin course sent to pharmacy. Refill provided for promethazine DM. He will update in a few days.

## 2020-04-04 DIAGNOSIS — F320 Major depressive disorder, single episode, mild: Secondary | ICD-10-CM | POA: Diagnosis not present

## 2020-04-11 DIAGNOSIS — F320 Major depressive disorder, single episode, mild: Secondary | ICD-10-CM | POA: Diagnosis not present

## 2020-04-18 DIAGNOSIS — F320 Major depressive disorder, single episode, mild: Secondary | ICD-10-CM | POA: Diagnosis not present

## 2020-04-25 DIAGNOSIS — F320 Major depressive disorder, single episode, mild: Secondary | ICD-10-CM | POA: Diagnosis not present

## 2020-05-02 DIAGNOSIS — F320 Major depressive disorder, single episode, mild: Secondary | ICD-10-CM | POA: Diagnosis not present

## 2020-05-08 IMAGING — DX DG ANKLE COMPLETE 3+V*R*
3 series · 3 of 3 positions shown · non-contrast
Comparison: None.

CLINICAL DATA: Chronic posterior right ankle pain. No recent
injury.

EXAM:
RIGHT ANKLE - COMPLETE 3+ VIEW

[ankle ap]
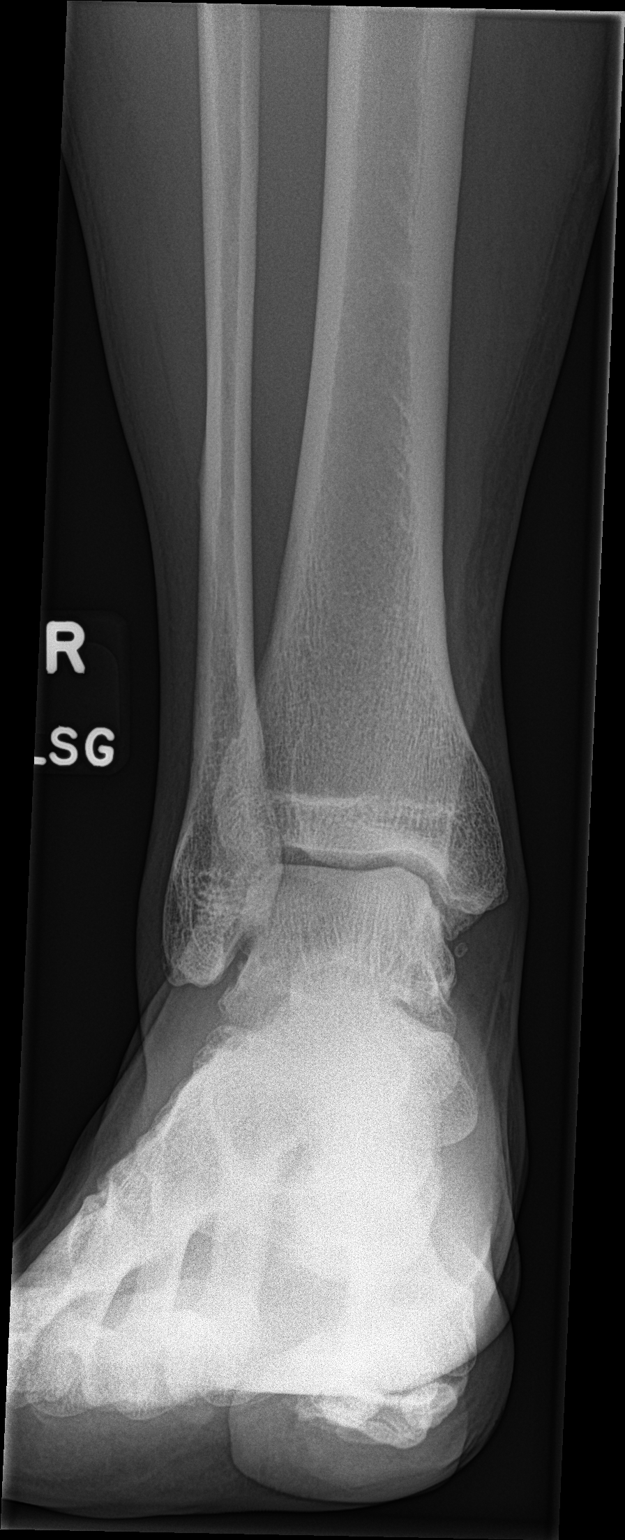

[ankle obl]
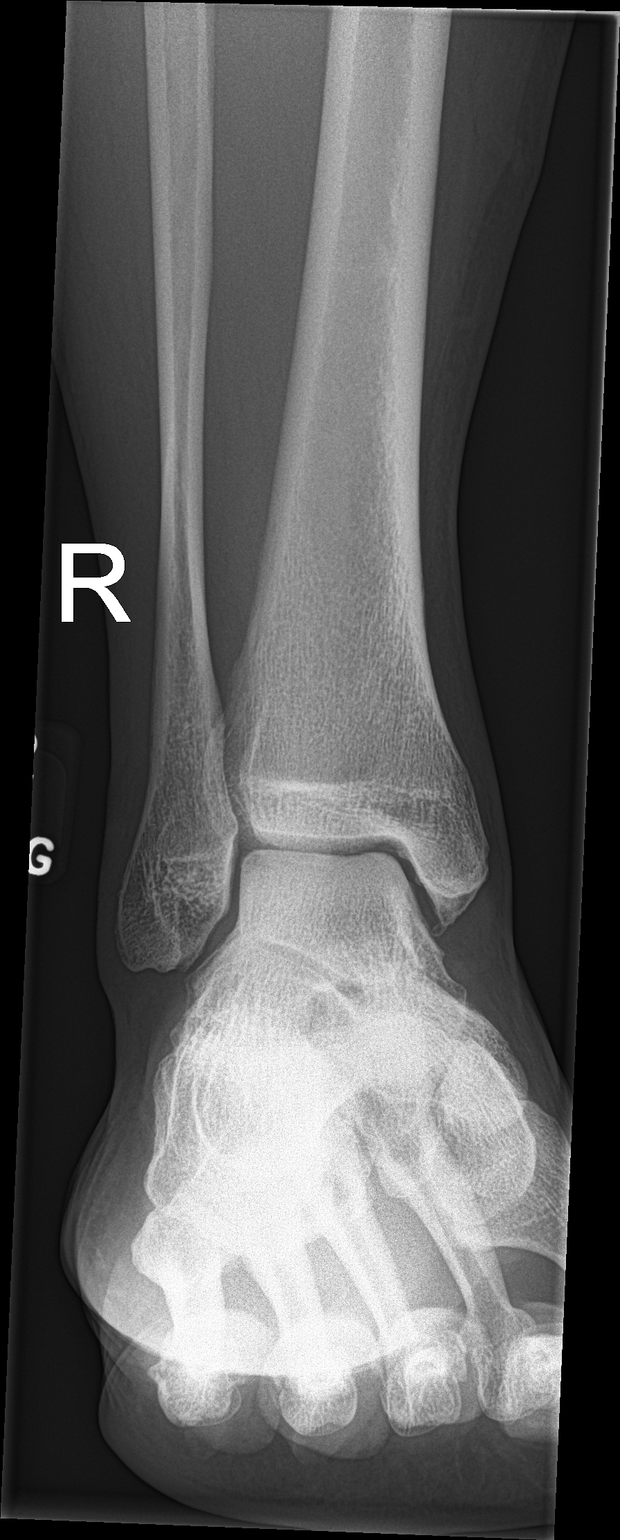

[ankle lat]
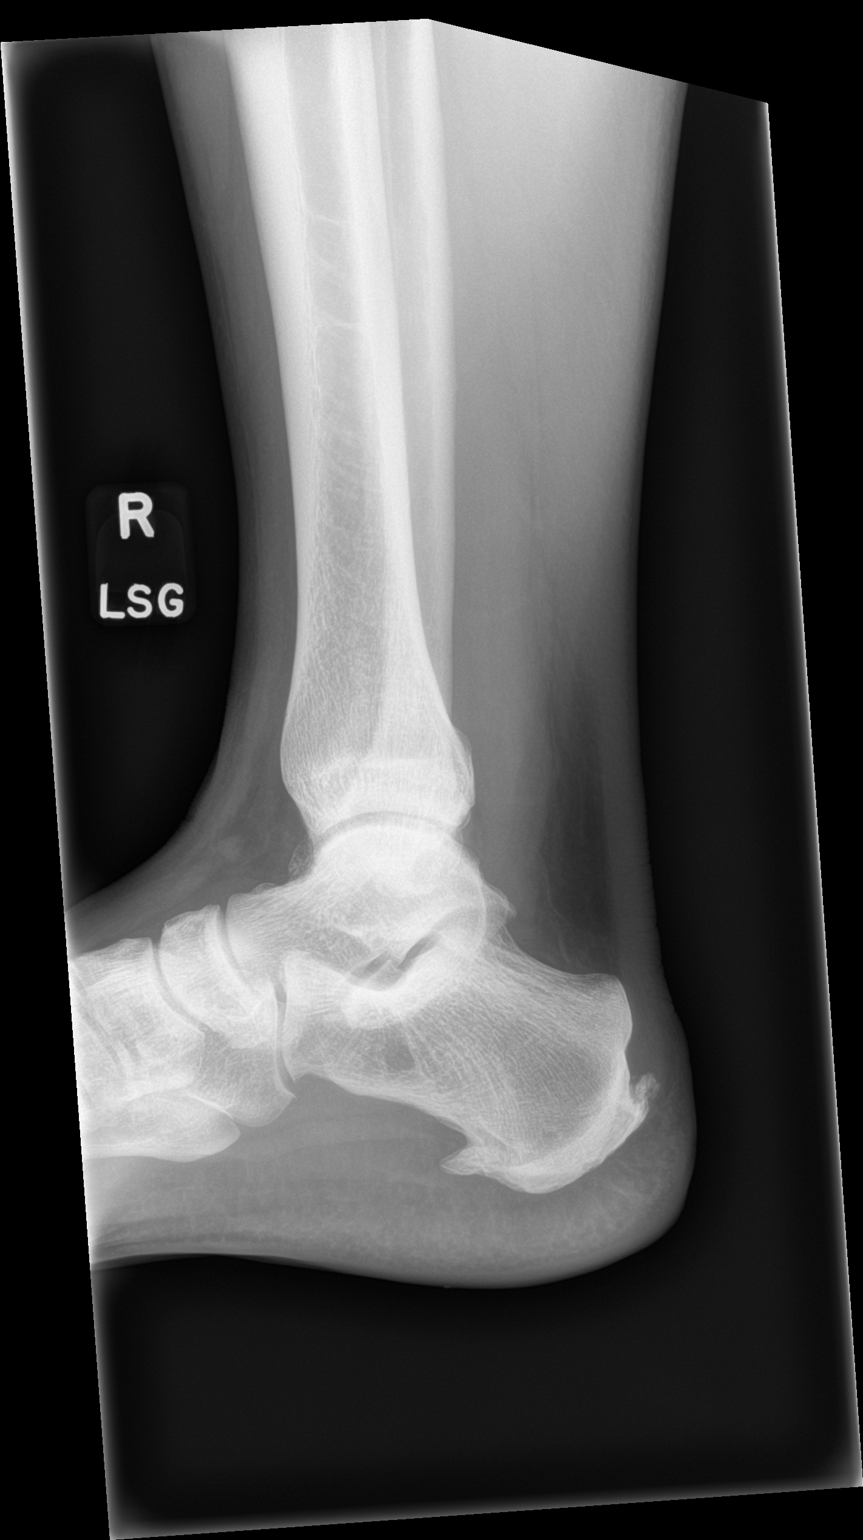

[3 of 3 positions shown; findings below may reference images not displayed]

FINDINGS: Posterior and plantar calcaneal spurs. No acute bony abnormality.
Specifically, no fracture, subluxation, or dislocation. Ankle joint
space is maintained.
IMPRESSION: Calcaneal spurs.  No acute bony abnormality.

## 2020-05-09 DIAGNOSIS — F320 Major depressive disorder, single episode, mild: Secondary | ICD-10-CM | POA: Diagnosis not present

## 2020-05-16 DIAGNOSIS — F320 Major depressive disorder, single episode, mild: Secondary | ICD-10-CM | POA: Diagnosis not present

## 2020-05-23 DIAGNOSIS — F320 Major depressive disorder, single episode, mild: Secondary | ICD-10-CM | POA: Diagnosis not present

## 2020-05-30 DIAGNOSIS — F320 Major depressive disorder, single episode, mild: Secondary | ICD-10-CM | POA: Diagnosis not present

## 2020-06-06 DIAGNOSIS — F320 Major depressive disorder, single episode, mild: Secondary | ICD-10-CM | POA: Diagnosis not present

## 2020-06-13 DIAGNOSIS — F320 Major depressive disorder, single episode, mild: Secondary | ICD-10-CM | POA: Diagnosis not present

## 2020-06-20 DIAGNOSIS — F320 Major depressive disorder, single episode, mild: Secondary | ICD-10-CM | POA: Diagnosis not present

## 2020-06-27 DIAGNOSIS — F320 Major depressive disorder, single episode, mild: Secondary | ICD-10-CM | POA: Diagnosis not present

## 2020-07-11 DIAGNOSIS — F320 Major depressive disorder, single episode, mild: Secondary | ICD-10-CM | POA: Diagnosis not present

## 2020-07-18 DIAGNOSIS — F320 Major depressive disorder, single episode, mild: Secondary | ICD-10-CM | POA: Diagnosis not present

## 2020-07-25 DIAGNOSIS — F320 Major depressive disorder, single episode, mild: Secondary | ICD-10-CM | POA: Diagnosis not present

## 2020-07-31 DIAGNOSIS — R635 Abnormal weight gain: Secondary | ICD-10-CM | POA: Diagnosis not present

## 2020-08-01 DIAGNOSIS — F320 Major depressive disorder, single episode, mild: Secondary | ICD-10-CM | POA: Diagnosis not present

## 2020-08-02 DIAGNOSIS — R7989 Other specified abnormal findings of blood chemistry: Secondary | ICD-10-CM | POA: Diagnosis not present

## 2020-08-02 DIAGNOSIS — E785 Hyperlipidemia, unspecified: Secondary | ICD-10-CM | POA: Diagnosis not present

## 2020-08-02 DIAGNOSIS — I498 Other specified cardiac arrhythmias: Secondary | ICD-10-CM | POA: Diagnosis not present

## 2020-08-02 DIAGNOSIS — Z8669 Personal history of other diseases of the nervous system and sense organs: Secondary | ICD-10-CM | POA: Diagnosis not present

## 2020-08-02 DIAGNOSIS — R635 Abnormal weight gain: Secondary | ICD-10-CM | POA: Diagnosis not present

## 2020-08-02 DIAGNOSIS — I4510 Unspecified right bundle-branch block: Secondary | ICD-10-CM | POA: Diagnosis not present

## 2020-08-22 DIAGNOSIS — F320 Major depressive disorder, single episode, mild: Secondary | ICD-10-CM | POA: Diagnosis not present

## 2020-08-23 DIAGNOSIS — E8881 Metabolic syndrome: Secondary | ICD-10-CM | POA: Diagnosis not present

## 2020-08-23 DIAGNOSIS — E786 Lipoprotein deficiency: Secondary | ICD-10-CM | POA: Diagnosis not present

## 2020-08-23 DIAGNOSIS — Z6839 Body mass index (BMI) 39.0-39.9, adult: Secondary | ICD-10-CM | POA: Diagnosis not present

## 2020-08-29 DIAGNOSIS — F320 Major depressive disorder, single episode, mild: Secondary | ICD-10-CM | POA: Diagnosis not present

## 2020-09-05 DIAGNOSIS — F320 Major depressive disorder, single episode, mild: Secondary | ICD-10-CM | POA: Diagnosis not present

## 2020-09-05 DIAGNOSIS — Z713 Dietary counseling and surveillance: Secondary | ICD-10-CM | POA: Diagnosis not present

## 2020-09-18 DIAGNOSIS — Z713 Dietary counseling and surveillance: Secondary | ICD-10-CM | POA: Diagnosis not present

## 2020-09-19 DIAGNOSIS — F320 Major depressive disorder, single episode, mild: Secondary | ICD-10-CM | POA: Diagnosis not present

## 2020-09-26 DIAGNOSIS — F320 Major depressive disorder, single episode, mild: Secondary | ICD-10-CM | POA: Diagnosis not present

## 2020-10-03 DIAGNOSIS — F320 Major depressive disorder, single episode, mild: Secondary | ICD-10-CM | POA: Diagnosis not present

## 2020-10-08 DIAGNOSIS — F320 Major depressive disorder, single episode, mild: Secondary | ICD-10-CM | POA: Diagnosis not present

## 2020-10-10 DIAGNOSIS — E781 Pure hyperglyceridemia: Secondary | ICD-10-CM | POA: Diagnosis not present

## 2020-10-10 DIAGNOSIS — R635 Abnormal weight gain: Secondary | ICD-10-CM | POA: Diagnosis not present

## 2020-10-10 DIAGNOSIS — E786 Lipoprotein deficiency: Secondary | ICD-10-CM | POA: Diagnosis not present

## 2020-10-10 DIAGNOSIS — Z6839 Body mass index (BMI) 39.0-39.9, adult: Secondary | ICD-10-CM | POA: Diagnosis not present

## 2020-10-10 DIAGNOSIS — E8881 Metabolic syndrome: Secondary | ICD-10-CM | POA: Diagnosis not present

## 2020-10-17 DIAGNOSIS — F320 Major depressive disorder, single episode, mild: Secondary | ICD-10-CM | POA: Diagnosis not present

## 2020-10-18 DIAGNOSIS — E669 Obesity, unspecified: Secondary | ICD-10-CM | POA: Diagnosis not present

## 2020-10-18 DIAGNOSIS — E8881 Metabolic syndrome: Secondary | ICD-10-CM | POA: Diagnosis not present

## 2020-10-18 DIAGNOSIS — Z713 Dietary counseling and surveillance: Secondary | ICD-10-CM | POA: Diagnosis not present

## 2020-10-24 DIAGNOSIS — F320 Major depressive disorder, single episode, mild: Secondary | ICD-10-CM | POA: Diagnosis not present

## 2020-11-07 DIAGNOSIS — F320 Major depressive disorder, single episode, mild: Secondary | ICD-10-CM | POA: Diagnosis not present

## 2020-11-14 DIAGNOSIS — F320 Major depressive disorder, single episode, mild: Secondary | ICD-10-CM | POA: Diagnosis not present

## 2020-11-28 DIAGNOSIS — F320 Major depressive disorder, single episode, mild: Secondary | ICD-10-CM | POA: Diagnosis not present

## 2020-12-05 DIAGNOSIS — F320 Major depressive disorder, single episode, mild: Secondary | ICD-10-CM | POA: Diagnosis not present

## 2020-12-12 DIAGNOSIS — F320 Major depressive disorder, single episode, mild: Secondary | ICD-10-CM | POA: Diagnosis not present

## 2020-12-19 DIAGNOSIS — F320 Major depressive disorder, single episode, mild: Secondary | ICD-10-CM | POA: Diagnosis not present

## 2020-12-26 DIAGNOSIS — F320 Major depressive disorder, single episode, mild: Secondary | ICD-10-CM | POA: Diagnosis not present

## 2021-01-09 ENCOUNTER — Ambulatory Visit: Payer: BC Managed Care – PPO

## 2021-01-09 ENCOUNTER — Other Ambulatory Visit: Payer: Self-pay

## 2021-01-09 DIAGNOSIS — F320 Major depressive disorder, single episode, mild: Secondary | ICD-10-CM | POA: Diagnosis not present

## 2021-01-09 DIAGNOSIS — Z713 Dietary counseling and surveillance: Secondary | ICD-10-CM | POA: Diagnosis not present

## 2021-01-09 DIAGNOSIS — Z23 Encounter for immunization: Secondary | ICD-10-CM

## 2021-01-09 DIAGNOSIS — E669 Obesity, unspecified: Secondary | ICD-10-CM | POA: Diagnosis not present

## 2021-01-16 DIAGNOSIS — E8881 Metabolic syndrome: Secondary | ICD-10-CM | POA: Diagnosis not present

## 2021-01-16 DIAGNOSIS — E786 Lipoprotein deficiency: Secondary | ICD-10-CM | POA: Diagnosis not present

## 2021-01-16 DIAGNOSIS — R635 Abnormal weight gain: Secondary | ICD-10-CM | POA: Diagnosis not present

## 2021-01-16 DIAGNOSIS — Z6839 Body mass index (BMI) 39.0-39.9, adult: Secondary | ICD-10-CM | POA: Diagnosis not present

## 2021-01-16 DIAGNOSIS — E781 Pure hyperglyceridemia: Secondary | ICD-10-CM | POA: Diagnosis not present

## 2021-01-23 DIAGNOSIS — F320 Major depressive disorder, single episode, mild: Secondary | ICD-10-CM | POA: Diagnosis not present

## 2021-01-30 DIAGNOSIS — F320 Major depressive disorder, single episode, mild: Secondary | ICD-10-CM | POA: Diagnosis not present

## 2021-02-04 DIAGNOSIS — E8881 Metabolic syndrome: Secondary | ICD-10-CM | POA: Diagnosis not present

## 2021-02-04 DIAGNOSIS — E785 Hyperlipidemia, unspecified: Secondary | ICD-10-CM | POA: Diagnosis not present

## 2021-02-04 DIAGNOSIS — F54 Psychological and behavioral factors associated with disorders or diseases classified elsewhere: Secondary | ICD-10-CM | POA: Diagnosis not present

## 2021-02-04 DIAGNOSIS — E669 Obesity, unspecified: Secondary | ICD-10-CM | POA: Diagnosis not present

## 2021-02-05 DIAGNOSIS — F320 Major depressive disorder, single episode, mild: Secondary | ICD-10-CM | POA: Diagnosis not present

## 2021-03-17 HISTORY — PX: REPAIR OF COMPLEX TRACTION RETINAL DETACHMENT: SHX6217

## 2021-10-23 ENCOUNTER — Encounter (INDEPENDENT_AMBULATORY_CARE_PROVIDER_SITE_OTHER): Payer: Self-pay

## 2022-01-15 DIAGNOSIS — N179 Acute kidney failure, unspecified: Secondary | ICD-10-CM | POA: Diagnosis not present

## 2022-01-15 DIAGNOSIS — L03116 Cellulitis of left lower limb: Secondary | ICD-10-CM | POA: Diagnosis not present

## 2022-01-15 DIAGNOSIS — A4902 Methicillin resistant Staphylococcus aureus infection, unspecified site: Secondary | ICD-10-CM | POA: Diagnosis not present

## 2022-01-15 DIAGNOSIS — R7309 Other abnormal glucose: Secondary | ICD-10-CM | POA: Diagnosis not present

## 2022-01-15 DIAGNOSIS — L02416 Cutaneous abscess of left lower limb: Secondary | ICD-10-CM | POA: Diagnosis not present

## 2022-01-15 DIAGNOSIS — Z789 Other specified health status: Secondary | ICD-10-CM | POA: Diagnosis not present

## 2022-01-15 DIAGNOSIS — Z22322 Carrier or suspected carrier of Methicillin resistant Staphylococcus aureus: Secondary | ICD-10-CM | POA: Diagnosis not present

## 2022-01-15 DIAGNOSIS — F320 Major depressive disorder, single episode, mild: Secondary | ICD-10-CM | POA: Diagnosis not present

## 2022-01-15 DIAGNOSIS — D72829 Elevated white blood cell count, unspecified: Secondary | ICD-10-CM | POA: Diagnosis not present

## 2022-01-21 ENCOUNTER — Encounter: Payer: Self-pay | Admitting: Primary Care

## 2022-01-21 ENCOUNTER — Encounter: Payer: Self-pay | Admitting: *Deleted

## 2022-01-21 ENCOUNTER — Ambulatory Visit (INDEPENDENT_AMBULATORY_CARE_PROVIDER_SITE_OTHER): Payer: BC Managed Care – PPO | Admitting: Primary Care

## 2022-01-21 VITALS — BP 138/84 | HR 72 | Temp 98.2°F | Ht 72.0 in | Wt 293.0 lb

## 2022-01-21 DIAGNOSIS — L03116 Cellulitis of left lower limb: Secondary | ICD-10-CM | POA: Diagnosis not present

## 2022-01-21 DIAGNOSIS — L02416 Cutaneous abscess of left lower limb: Secondary | ICD-10-CM

## 2022-01-21 DIAGNOSIS — H33021 Retinal detachment with multiple breaks, right eye: Secondary | ICD-10-CM | POA: Diagnosis not present

## 2022-01-21 NOTE — Assessment & Plan Note (Signed)
Reviewed notes from ophthalmology from July and October 2023 per [facility].  Continue Cosopt 2-0.5% drops, prednisolone 1% drops, atropine 1% drops, and ofloxacin 0.3% drops.

## 2022-01-21 NOTE — Assessment & Plan Note (Signed)
[facility].   Admitted to hospital in [place], we do not have these records. Wound today appears to be healing well.  Referral placed to general surgery to have drain and sutures removed. Continue minocycline 100 mg BID until complete.

## 2022-01-21 NOTE — Progress Notes (Signed)
Subjective:    Patient ID: Richard [name], male    DOB: 1970/07/19, 51 y.o.   MRN: [id number]  HPI  Richard [name] is a very pleasant 51 y.o. male with a history of sleep apnea, insulin resistance, hyperlipidemia, obesity who presents today for hospital follow-up.  Symptom onset in July 2023 with floaters to the right eye which progressed to partial visual field loss over the next several days.  Several days later the visual field to his right eye was entirely blocked.  Evaluated by ophthalmology who diagnosed right retinal detachment with multiple breaks.  He underwent repair of retinal detachment on 10/01/21 per Dr. Leontine Locket through [facility].   He began to experience floaters with decreased visual fields again so he underwent additional surgery to right eye on 12/17/21, also a few repairs to the left eye.   Since his surgery he has followed up with ophthalmology.  He is managed on ofloxacin 0.3% drops, Cosopt 2-0.5% drops, Atropine 1% drops, and prednisolone 1% drops.  Recent hospitalization in [place] for abscess to the left lower extremity to the posterior thigh. Initially treated in the ED in [place] with oral antibiotics but he returned a few days later as he wasn't improving. He was admitted, treated with IV antibiotics and underwent surgery on 01/13/22. He was discharged on 01/15/22 with a prescription for minocycline 100 mg BID for a 7-10 day course. He also has a drain and sutures present for which need to be removed in one week. He is needing a referral to general surgery.  He denies fevers. His pain has improved significantly. He is changing his bandages regularly.   BP Readings from Last 3 Encounters:  01/21/22 138/84  03/25/20 (!) 143/89  02/24/20 134/76      Review of Systems  Constitutional:  Negative for fever.  Eyes:  Positive for visual disturbance.  Respiratory:  Negative for shortness of breath.   Cardiovascular:  Negative for palpitations.          Past Medical History:  Diagnosis Date   Back pain    Depression    Diverticulosis    Gallbladder problem    Lactose intolerance    Medical history non-contributory    Overweight     Social History   Socioeconomic History   Marital status: Divorced    Spouse name: Not on file   Number of children: Not on file   Years of education: Not on file   Highest education level: Not on file  Occupational History   Occupation: Secretary/administrator Professor, freelance video production  Tobacco Use   Smoking status: Never   Smokeless tobacco: Never  Vaping Use   Vaping Use: Never used  Substance and Sexual Activity   Alcohol use: No    Alcohol/week: 0.0 standard drinks of alcohol   Drug use: No   Sexual activity: Yes    Partners: Female  Other Topics Concern   Not on file  Social History Narrative   Not on file   Social Determinants of Health   Financial Resource Strain: Not on file  Food Insecurity: Not on file  Transportation Needs: Not on file  Physical Activity: Not on file  Stress: Not on file  Social Connections: Not on file  Intimate Partner Violence: Not on file    Past Surgical History:  Procedure Laterality Date   cerebral ataxia     as a baby   [place] Left  lipoma removal, arm/ forehead   NASAL SEPTOPLASTY W/ TURBINOPLASTY Bilateral 10/10/2015   Procedure: NASAL SEPTOPLASTY WITH BILATERAL TURBINATE REDUCTION;  Surgeon: [name], MD;  Location: [facility];  Service: ENT;  Laterality: Bilateral;   REPAIR OF COMPLEX TRACTION RETINAL DETACHMENT Right 2023   UVULECTOMY N/A 10/10/2015   Procedure: Myrtis Ser ASSISTED;  Surgeon: [name], MD;  Location: [facility] OR;  Service: ENT;  Laterality: N/A;    Family History  Problem Relation Age of Onset   Diabetes Mother    Cancer Mother 75       breast, skin,lymphoma, deceased   Breast cancer Mother    Depression Mother    Obesity Mother    Cancer Father        prostate, skin    Prostate cancer Father    Sleep apnea Father    Depression Sister    Cancer Sister        sarcoidosis   Depression Brother    Esophageal cancer Neg Hx    Rectal cancer Neg Hx    Stomach cancer Neg Hx    Colon cancer Neg Hx    Colon polyps Neg Hx     Allergies  Allergen Reactions   Ppd [Tuberculin Purified Protein Derivative] Other (See Comments)    + PPD NEG CXR 2/09    Current Outpatient Medications on File Prior to Visit  Medication Sig Dispense Refill   atropine 1 % ophthalmic solution Place into the right eye.     dorzolamide-timolol (COSOPT) 2-0.5 % ophthalmic solution Apply to eye.     FIBER ADULT GUMMIES PO Take by mouth.     minocycline (DYNACIN) 100 MG tablet Take 100 mg by mouth 2 (two) times daily.     Multiple Vitamin (MULTIVITAMIN) capsule Take 1 capsule by mouth daily.     ofloxacin (OCUFLOX) 0.3 % ophthalmic solution Place into the right eye.     oxyCODONE-acetaminophen (PERCOCET/ROXICET) 5-325 MG tablet Take by mouth.     prednisoLONE acetate (PRED FORTE) 1 % ophthalmic suspension SMARTSIG:In Eye(s)     WEGOVY 2.4 MG/0.75ML SOAJ Inject into the skin.     albuterol (VENTOLIN HFA) 108 (90 Base) MCG/ACT inhaler Inhale 1-2 puffs into the lungs every 6 (six) hours as needed for wheezing or shortness of breath. (Patient not taking: Reported on 01/21/2022) 18 g 0   Vitamin D, Ergocalciferol, (DRISDOL) 1.25 MG (50000 UNIT) CAPS capsule Take 1 capsule (50,000 Units total) by mouth every 7 (seven) days. (Patient not taking: Reported on 01/21/2022) 4 capsule 0   No current facility-administered medications on file prior to visit.    BP 138/84   Pulse 72   Temp 98.2 F (36.8 C) (Temporal)   Ht 6' (1.829 m)   Wt 293 lb (132.9 kg)   SpO2 98%   BMI 39.74 kg/m  Objective:   Physical Exam Cardiovascular:     Rate and Rhythm: Normal rate and regular rhythm.  Pulmonary:     Effort: Pulmonary effort is normal.     Breath sounds: Normal breath sounds. No wheezing or  rales.  Musculoskeletal:     Cervical back: Neck supple.  Skin:    General: Skin is warm and dry.     Comments: Open wound to left lower extremity distal to buttocks. Sutures intact. Light pink erythema surrounding. No warmth. Drain in place with serosanguinous drainage to bandage.   Neurological:     Mental Status: He is alert and oriented to person, place, and time.  Assessment & Plan:   Problem List Items Addressed This Visit       Other   Cellulitis and abscess of left lower extremity - Primary    [facility].   Admitted to hospital in [place], we do not have these records. Wound today appears to be healing well.  Referral placed to general surgery to have drain and sutures removed. Continue minocycline 100 mg BID until complete.       Relevant Orders   Ambulatory referral to General Surgery   Retinal detachment of right eye with multiple breaks    Reviewed notes from ophthalmology from July and October 2023 per [facility].  Continue Cosopt 2-0.5% drops, prednisolone 1% drops, atropine 1% drops, and ofloxacin 0.3% drops.           Pleas Koch, NP

## 2022-01-21 NOTE — Patient Instructions (Signed)
You will be contacted regarding your referral to general surgery.  Please let us know if you have not been contacted within two days.

## 2022-01-22 DIAGNOSIS — H33021 Retinal detachment with multiple breaks, right eye: Secondary | ICD-10-CM | POA: Diagnosis not present

## 2022-01-22 DIAGNOSIS — F320 Major depressive disorder, single episode, mild: Secondary | ICD-10-CM | POA: Diagnosis not present

## 2022-01-22 DIAGNOSIS — H33312 Horseshoe tear of retina without detachment, left eye: Secondary | ICD-10-CM | POA: Diagnosis not present

## 2022-01-22 DIAGNOSIS — H33001 Unspecified retinal detachment with retinal break, right eye: Secondary | ICD-10-CM | POA: Diagnosis not present

## 2022-01-27 DIAGNOSIS — L02416 Cutaneous abscess of left lower limb: Secondary | ICD-10-CM | POA: Diagnosis not present

## 2022-01-27 DIAGNOSIS — Z9889 Other specified postprocedural states: Secondary | ICD-10-CM | POA: Diagnosis not present

## 2022-01-29 DIAGNOSIS — F320 Major depressive disorder, single episode, mild: Secondary | ICD-10-CM | POA: Diagnosis not present

## 2022-02-12 DIAGNOSIS — F320 Major depressive disorder, single episode, mild: Secondary | ICD-10-CM | POA: Diagnosis not present

## 2022-02-14 DIAGNOSIS — R7309 Other abnormal glucose: Secondary | ICD-10-CM | POA: Diagnosis not present

## 2022-02-19 DIAGNOSIS — F320 Major depressive disorder, single episode, mild: Secondary | ICD-10-CM | POA: Diagnosis not present

## 2022-02-25 DIAGNOSIS — F320 Major depressive disorder, single episode, mild: Secondary | ICD-10-CM | POA: Diagnosis not present

## 2022-02-26 DIAGNOSIS — H33001 Unspecified retinal detachment with retinal break, right eye: Secondary | ICD-10-CM | POA: Diagnosis not present

## 2022-03-03 DIAGNOSIS — Z713 Dietary counseling and surveillance: Secondary | ICD-10-CM | POA: Diagnosis not present

## 2022-03-03 DIAGNOSIS — Z6841 Body Mass Index (BMI) 40.0 and over, adult: Secondary | ICD-10-CM | POA: Diagnosis not present

## 2022-03-05 DIAGNOSIS — F320 Major depressive disorder, single episode, mild: Secondary | ICD-10-CM | POA: Diagnosis not present

## 2022-03-12 DIAGNOSIS — F320 Major depressive disorder, single episode, mild: Secondary | ICD-10-CM | POA: Diagnosis not present

## 2022-03-17 DIAGNOSIS — R7309 Other abnormal glucose: Secondary | ICD-10-CM | POA: Diagnosis not present

## 2022-03-25 DIAGNOSIS — F320 Major depressive disorder, single episode, mild: Secondary | ICD-10-CM | POA: Diagnosis not present

## 2022-04-09 DIAGNOSIS — F320 Major depressive disorder, single episode, mild: Secondary | ICD-10-CM | POA: Diagnosis not present

## 2022-04-16 DIAGNOSIS — H5213 Myopia, bilateral: Secondary | ICD-10-CM | POA: Diagnosis not present

## 2022-04-16 DIAGNOSIS — F320 Major depressive disorder, single episode, mild: Secondary | ICD-10-CM | POA: Diagnosis not present

## 2022-04-16 DIAGNOSIS — H40002 Preglaucoma, unspecified, left eye: Secondary | ICD-10-CM | POA: Diagnosis not present

## 2022-04-16 DIAGNOSIS — Z9889 Other specified postprocedural states: Secondary | ICD-10-CM | POA: Diagnosis not present

## 2022-04-16 DIAGNOSIS — H2513 Age-related nuclear cataract, bilateral: Secondary | ICD-10-CM | POA: Diagnosis not present

## 2022-04-17 DIAGNOSIS — R7309 Other abnormal glucose: Secondary | ICD-10-CM | POA: Diagnosis not present

## 2022-04-21 DIAGNOSIS — E785 Hyperlipidemia, unspecified: Secondary | ICD-10-CM | POA: Diagnosis not present

## 2022-04-21 DIAGNOSIS — Z713 Dietary counseling and surveillance: Secondary | ICD-10-CM | POA: Diagnosis not present

## 2022-04-21 DIAGNOSIS — Z8669 Personal history of other diseases of the nervous system and sense organs: Secondary | ICD-10-CM | POA: Diagnosis not present

## 2022-04-23 DIAGNOSIS — F320 Major depressive disorder, single episode, mild: Secondary | ICD-10-CM | POA: Diagnosis not present

## 2022-04-29 ENCOUNTER — Encounter: Payer: Self-pay | Admitting: Gastroenterology

## 2022-04-29 DIAGNOSIS — H40023 Open angle with borderline findings, high risk, bilateral: Secondary | ICD-10-CM | POA: Diagnosis not present

## 2022-04-29 DIAGNOSIS — H33312 Horseshoe tear of retina without detachment, left eye: Secondary | ICD-10-CM | POA: Diagnosis not present

## 2022-04-29 DIAGNOSIS — H3321 Serous retinal detachment, right eye: Secondary | ICD-10-CM | POA: Diagnosis not present

## 2022-04-29 DIAGNOSIS — Z8669 Personal history of other diseases of the nervous system and sense organs: Secondary | ICD-10-CM | POA: Diagnosis not present

## 2022-04-30 DIAGNOSIS — F320 Major depressive disorder, single episode, mild: Secondary | ICD-10-CM | POA: Diagnosis not present

## 2022-05-07 DIAGNOSIS — F320 Major depressive disorder, single episode, mild: Secondary | ICD-10-CM | POA: Diagnosis not present

## 2022-05-14 DIAGNOSIS — F320 Major depressive disorder, single episode, mild: Secondary | ICD-10-CM | POA: Diagnosis not present

## 2022-05-16 DIAGNOSIS — R7309 Other abnormal glucose: Secondary | ICD-10-CM | POA: Diagnosis not present

## 2022-05-21 DIAGNOSIS — H40002 Preglaucoma, unspecified, left eye: Secondary | ICD-10-CM | POA: Diagnosis not present

## 2022-05-21 DIAGNOSIS — H2512 Age-related nuclear cataract, left eye: Secondary | ICD-10-CM | POA: Diagnosis not present

## 2022-05-21 DIAGNOSIS — H5213 Myopia, bilateral: Secondary | ICD-10-CM | POA: Diagnosis not present

## 2022-05-21 DIAGNOSIS — F320 Major depressive disorder, single episode, mild: Secondary | ICD-10-CM | POA: Diagnosis not present

## 2022-05-21 DIAGNOSIS — H25811 Combined forms of age-related cataract, right eye: Secondary | ICD-10-CM | POA: Diagnosis not present

## 2022-05-22 ENCOUNTER — Telehealth: Payer: BC Managed Care – PPO | Admitting: Nurse Practitioner

## 2022-05-22 ENCOUNTER — Telehealth (INDEPENDENT_AMBULATORY_CARE_PROVIDER_SITE_OTHER): Payer: BC Managed Care – PPO | Admitting: Family Medicine

## 2022-05-22 ENCOUNTER — Ambulatory Visit (INDEPENDENT_AMBULATORY_CARE_PROVIDER_SITE_OTHER): Payer: Self-pay | Admitting: Adult Health

## 2022-05-22 ENCOUNTER — Encounter: Payer: Self-pay | Admitting: Family Medicine

## 2022-05-22 ENCOUNTER — Encounter: Payer: Self-pay | Admitting: Adult Health

## 2022-05-22 VITALS — Ht 72.0 in | Wt 290.0 lb

## 2022-05-22 DIAGNOSIS — U071 COVID-19: Secondary | ICD-10-CM

## 2022-05-22 MED ORDER — NIRMATRELVIR/RITONAVIR (PAXLOVID)TABLET: 3.0000 | ORAL_TABLET | Freq: Two times a day (BID) | ORAL | 0 refills | Status: AC

## 2022-05-22 NOTE — Progress Notes (Signed)
Virtual Visit via Video   I connected with patient on 05/22/22 at 10:20 AM EST by a video enabled telemedicine application and verified that I am speaking with the correct person using two identifiers.  Location patient: Home Location provider: [facility], Office Persons participating in the virtual visit: Patient, Provider, [place] Marcille Blanco C)  I discussed the limitations of evaluation and management by telemedicine and the availability of in person appointments. The patient expressed understanding and agreed to proceed.  Subjective:   HPI:   COVID- pt woke w/ mild congestion yesterday.  As day went on, he felt excessively fatigued.  Took NyQuil last night- stuffy, headache, productive cough.  Took COVID test this AM- Positive.  Tm- 100.2  + sinus pain/pressure.  Denies wheezing or SOB unless coughing.  Mild nausea, no vomiting or diarrhea.  Decreased appetite.  Some body aches.  Pt has taken Paxlovid before w/o difficulty.  ROS:   See pertinent positives and negatives per HPI.  Patient Active Problem List   Diagnosis Date Noted   Cellulitis and abscess of left lower extremity 01/21/2022   Retinal detachment of right eye with multiple breaks 01/21/2022   Lower respiratory infection 03/30/2020   Insulin resistance 11/10/2019   Family history of prostate cancer in father 01/18/2019   Vasectomy evaluation 01/18/2019   Obesity (BMI 30.0-34.9) 01/15/2018   Chronic foot pain 01/15/2018   Preventative health care 12/11/2015   Onychomycosis 10/18/2015   Sleep apnea 10/10/2015   Tinea pedis of both feet 07/29/2015   Xerosis of skin 07/29/2015   Toenail deformity 06/26/2015   Lipoma 06/26/2015   Chronic nasal congestion 06/26/2015   Vitamin D deficiency    Elevated cholesterol     Social History   Tobacco Use   Smoking status: Never   Smokeless tobacco: Never  Substance Use Topics   Alcohol use: No    Alcohol/week: 0.0 standard drinks of alcohol    Current  Outpatient Medications:    FIBER ADULT GUMMIES PO, Take by mouth., Disp: , Rfl:    minocycline (DYNACIN) 100 MG tablet, Take 100 mg by mouth 2 (two) times daily., Disp: , Rfl:    Multiple Vitamin (MULTIVITAMIN) capsule, Take 1 capsule by mouth daily., Disp: , Rfl:    Vitamin D, Ergocalciferol, (DRISDOL) 1.25 MG (50000 UNIT) CAPS capsule, Take 1 capsule (50,000 Units total) by mouth every 7 (seven) days., Disp: 4 capsule, Rfl: 0   WEGOVY 2.4 MG/0.75ML SOAJ, Inject into the skin., Disp: , Rfl:    albuterol (VENTOLIN HFA) 108 (90 Base) MCG/ACT inhaler, Inhale 1-2 puffs into the lungs every 6 (six) hours as needed for wheezing or shortness of breath. (Patient not taking: Reported on 01/21/2022), Disp: 18 g, Rfl: 0   atropine 1 % ophthalmic solution, Place into the right eye. (Patient not taking: Reported on 05/22/2022), Disp: , Rfl:    dorzolamide-timolol (COSOPT) 2-0.5 % ophthalmic solution, Apply to eye. (Patient not taking: Reported on 05/22/2022), Disp: , Rfl:    ofloxacin (OCUFLOX) 0.3 % ophthalmic solution, Place into the right eye. (Patient not taking: Reported on 05/22/2022), Disp: , Rfl:    oxyCODONE-acetaminophen (PERCOCET/ROXICET) 5-325 MG tablet, Take by mouth. (Patient not taking: Reported on 05/22/2022), Disp: , Rfl:    prednisoLONE acetate (PRED FORTE) 1 % ophthalmic suspension, SMARTSIG:In Eye(s) (Patient not taking: Reported on 05/22/2022), Disp: , Rfl:   Allergies  Allergen Reactions   Ppd [Tuberculin Purified Protein Derivative] Other (See Comments)    + PPD NEG CXR 2/09  Objective:   Ht 6' (1.829 m)   Wt 290 lb (131.5 kg)   BMI 39.33 kg/m  AAOx3, NAD NCAT, EOMI No obvious CN deficits Coloring WNL Pt is able to speak clearly, coherently without shortness of breath or increased work of breathing.  Thought process is linear.  Mood is appropriate.   Assessment and Plan:   COVID- new to provider.  Pt has had previously a few years ago.  Sxs are consistent w/ dx and rapid test  confirmed.  Reviewed medication list to ensure Paxlovid was a safe option.  Reviewed supportive care and red flags that should prompt return.  Pt expressed understanding and is in agreement w/ plan.    Annye Asa, MD 05/22/2022

## 2022-05-22 NOTE — Progress Notes (Signed)
Virtual Visit Consent   Richard [name], you are scheduled for a virtual visit with a [facility] provider today. Just as with appointments in the office, your consent must be obtained to participate.   I need to obtain your verbal consent now. Are you willing to proceed with your visit today? [name] has provided verbal consent on 05/22/2022 for a virtual visit (video or telephone). Kendell Bane, NP  Date: 05/22/2022 9:05 AM  Virtual Visit via Video Note   I, Kendell Bane, connected with  [name]  (AV:[id number], [birth year]) on 05/22/22 at  8:30 AM EST by telephone and verified that I am speaking with the correct person using two identifiers.  Location: Patient: Virtual Visit Location Patient: Home Provider: Virtual Visit Location Provider: Office/Clinic   I discussed the limitations of evaluation and management by telemedicine and the availability of in person appointments. The patient expressed understanding and agreed to proceed.    History of Present Illness: Richard [name] is a 52 y.o. and is being seen today reporting that he tested positive for covid.  He is symptomatic and feels feverish.  He is looking for protocall for returning to work etc.    Problems:  Patient Active Problem List   Diagnosis Date Noted   Cellulitis and abscess of left lower extremity 01/21/2022   Retinal detachment of right eye with multiple breaks 01/21/2022   Lower respiratory infection 03/30/2020   Insulin resistance 11/10/2019   Family history of prostate cancer in father 01/18/2019   Vasectomy evaluation 01/18/2019   Obesity (BMI 30.0-34.9) 01/15/2018   Chronic foot pain 01/15/2018   Preventative health care 12/11/2015   Onychomycosis 10/18/2015   Sleep apnea 10/10/2015   Tinea pedis of both feet 07/29/2015   Xerosis of skin 07/29/2015   Toenail deformity 06/26/2015   Lipoma 06/26/2015   Chronic nasal congestion 06/26/2015   Vitamin D deficiency    Elevated cholesterol     Allergies:  Allergies   Allergen Reactions   Ppd [Tuberculin Purified Protein Derivative] Other (See Comments)    + PPD NEG CXR 2/09   Medications:  Current Outpatient Medications:    albuterol (VENTOLIN HFA) 108 (90 Base) MCG/ACT inhaler, Inhale 1-2 puffs into the lungs every 6 (six) hours as needed for wheezing or shortness of breath. (Patient not taking: Reported on 01/21/2022), Disp: 18 g, Rfl: 0   atropine 1 % ophthalmic solution, Place into the right eye., Disp: , Rfl:    dorzolamide-timolol (COSOPT) 2-0.5 % ophthalmic solution, Apply to eye., Disp: , Rfl:    FIBER ADULT GUMMIES PO, Take by mouth., Disp: , Rfl:    minocycline (DYNACIN) 100 MG tablet, Take 100 mg by mouth 2 (two) times daily., Disp: , Rfl:    Multiple Vitamin (MULTIVITAMIN) capsule, Take 1 capsule by mouth daily., Disp: , Rfl:    ofloxacin (OCUFLOX) 0.3 % ophthalmic solution, Place into the right eye., Disp: , Rfl:    oxyCODONE-acetaminophen (PERCOCET/ROXICET) 5-325 MG tablet, Take by mouth., Disp: , Rfl:    prednisoLONE acetate (PRED FORTE) 1 % ophthalmic suspension, SMARTSIG:In Eye(s), Disp: , Rfl:    Vitamin D, Ergocalciferol, (DRISDOL) 1.25 MG (50000 UNIT) CAPS capsule, Take 1 capsule (50,000 Units total) by mouth every 7 (seven) days. (Patient not taking: Reported on 01/21/2022), Disp: 4 capsule, Rfl: 0   WEGOVY 2.4 MG/0.75ML SOAJ, Inject into the skin., Disp: , Rfl:   Observations/Objective: No labored breathing. Speaking in full sentences Speech is clear and coherent with logical content.  Patient is alert and oriented at baseline.   Assessment and Plan: 1. COVID-19 Return to work when improving and temp is less than 100.4  Please wear a mask if you are symptomatic, and wash your hands.  Rest, and drink plenty of water.  Use cough drops, gargle warm sal water or drink wam liquids (like tea with honey) as needed for cough/throat irritation.   Take over-the-counter medicines (such as Dayquil or Nyquil) as discussed at your visit to  help manage your symptoms.  Send a MyChart message to the provider or schedule a return appointment as needed for new/worsening symptoms (especially shortness of breath or chest pain) or if symptoms not improving with recommended treatment over the next 5-7 days.       Follow Up Instructions: I discussed the assessment and treatment plan with the patient. The patient was provided an opportunity to ask questions and all were answered. The patient agreed with the plan and demonstrated an understanding of the instructions.    The patient was advised to call back or seek an in-person evaluation if the symptoms worsen or if the condition fails to improve as anticipated.  Time:  I spent 15 minutes with the patient via telehealth technology discussing the above problems/concerns.    Kendell Bane, NP

## 2022-05-28 DIAGNOSIS — F320 Major depressive disorder, single episode, mild: Secondary | ICD-10-CM | POA: Diagnosis not present

## 2022-06-02 DIAGNOSIS — Z713 Dietary counseling and surveillance: Secondary | ICD-10-CM | POA: Diagnosis not present

## 2022-06-02 DIAGNOSIS — E785 Hyperlipidemia, unspecified: Secondary | ICD-10-CM | POA: Diagnosis not present

## 2022-06-02 DIAGNOSIS — Z6841 Body Mass Index (BMI) 40.0 and over, adult: Secondary | ICD-10-CM | POA: Diagnosis not present

## 2022-06-04 DIAGNOSIS — F320 Major depressive disorder, single episode, mild: Secondary | ICD-10-CM | POA: Diagnosis not present

## 2022-06-11 DIAGNOSIS — F320 Major depressive disorder, single episode, mild: Secondary | ICD-10-CM | POA: Diagnosis not present

## 2022-06-16 DIAGNOSIS — R7309 Other abnormal glucose: Secondary | ICD-10-CM | POA: Diagnosis not present

## 2022-06-18 DIAGNOSIS — F320 Major depressive disorder, single episode, mild: Secondary | ICD-10-CM | POA: Diagnosis not present

## 2022-06-25 DIAGNOSIS — F320 Major depressive disorder, single episode, mild: Secondary | ICD-10-CM | POA: Diagnosis not present

## 2022-07-02 DIAGNOSIS — F320 Major depressive disorder, single episode, mild: Secondary | ICD-10-CM | POA: Diagnosis not present

## 2022-07-09 DIAGNOSIS — F320 Major depressive disorder, single episode, mild: Secondary | ICD-10-CM | POA: Diagnosis not present

## 2022-07-14 DIAGNOSIS — Z713 Dietary counseling and surveillance: Secondary | ICD-10-CM | POA: Diagnosis not present

## 2022-07-14 DIAGNOSIS — E785 Hyperlipidemia, unspecified: Secondary | ICD-10-CM | POA: Diagnosis not present

## 2022-07-14 DIAGNOSIS — Z6841 Body Mass Index (BMI) 40.0 and over, adult: Secondary | ICD-10-CM | POA: Diagnosis not present

## 2022-07-14 DIAGNOSIS — R635 Abnormal weight gain: Secondary | ICD-10-CM | POA: Diagnosis not present

## 2022-07-16 DIAGNOSIS — F320 Major depressive disorder, single episode, mild: Secondary | ICD-10-CM | POA: Diagnosis not present

## 2022-07-17 IMAGING — DX DG CHEST 2V
2 series · 2 of 2 positions shown · non-contrast
Comparison: None.

CLINICAL DATA: Two weeks of worsening productive cough.  Soreness.

EXAM:
CHEST - 2 VIEW

[chest pa]
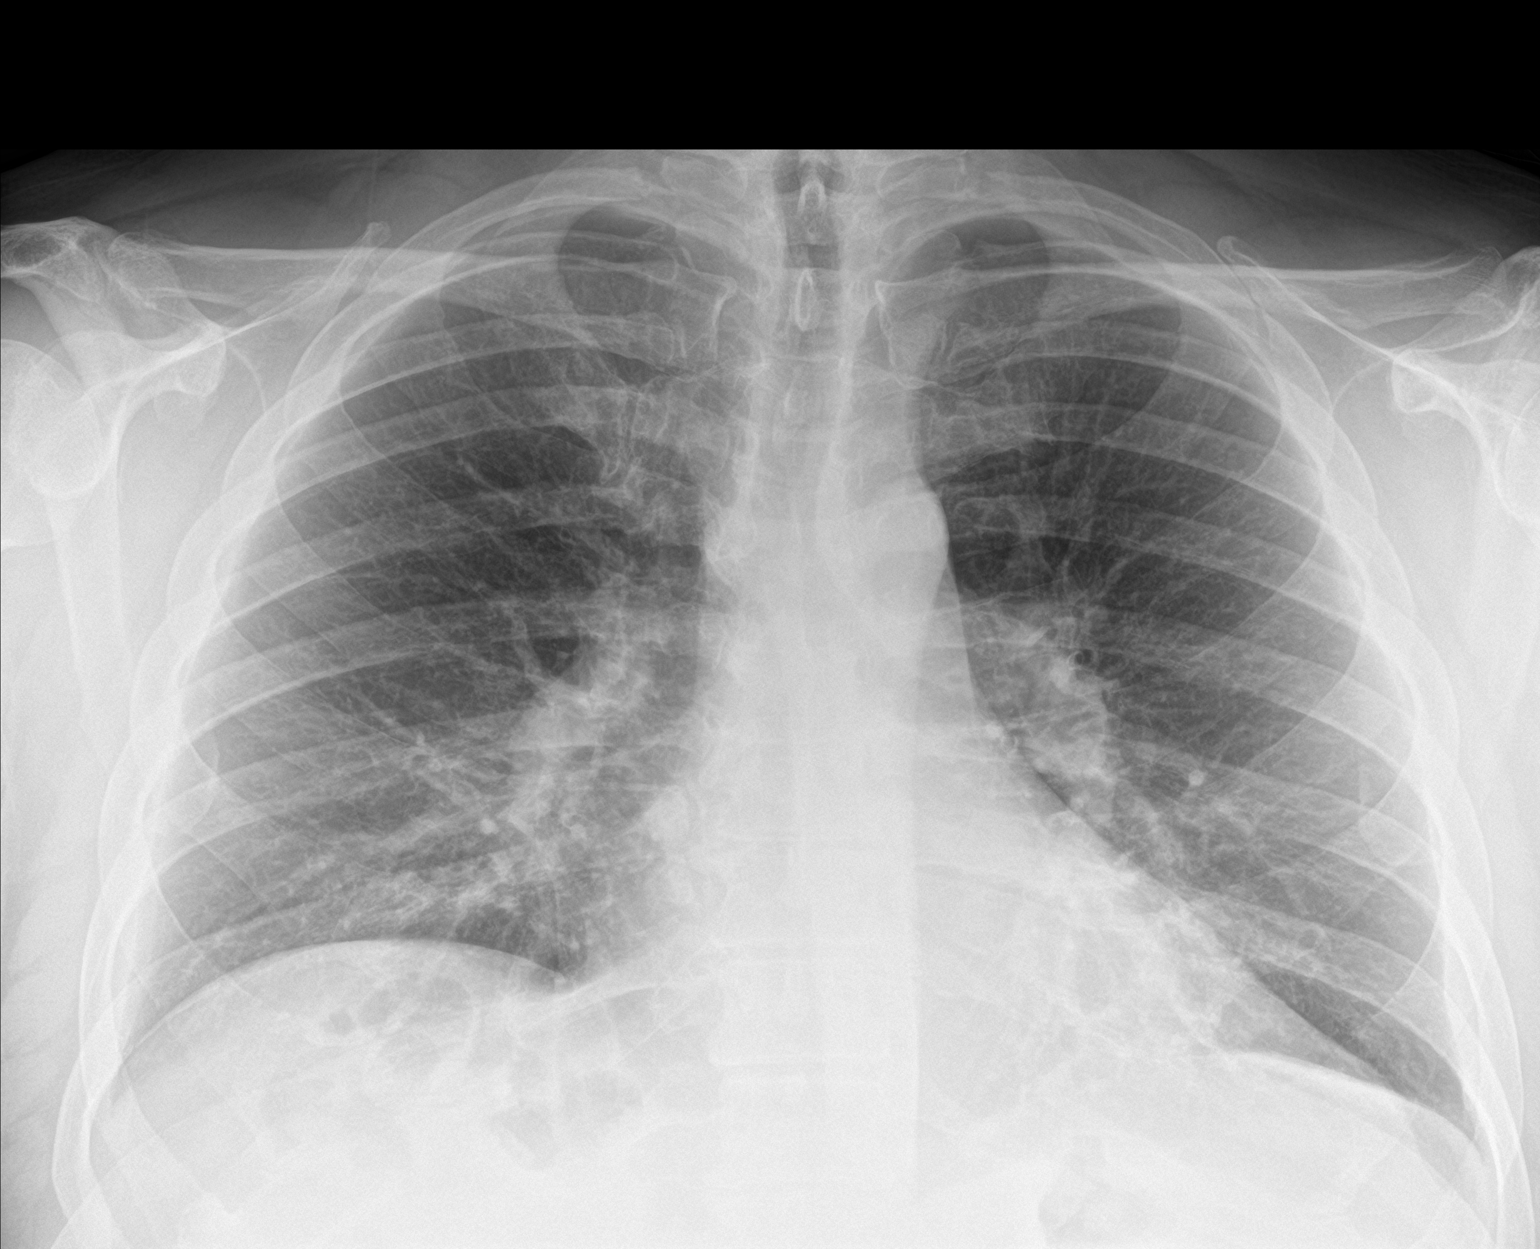

[chest lat]
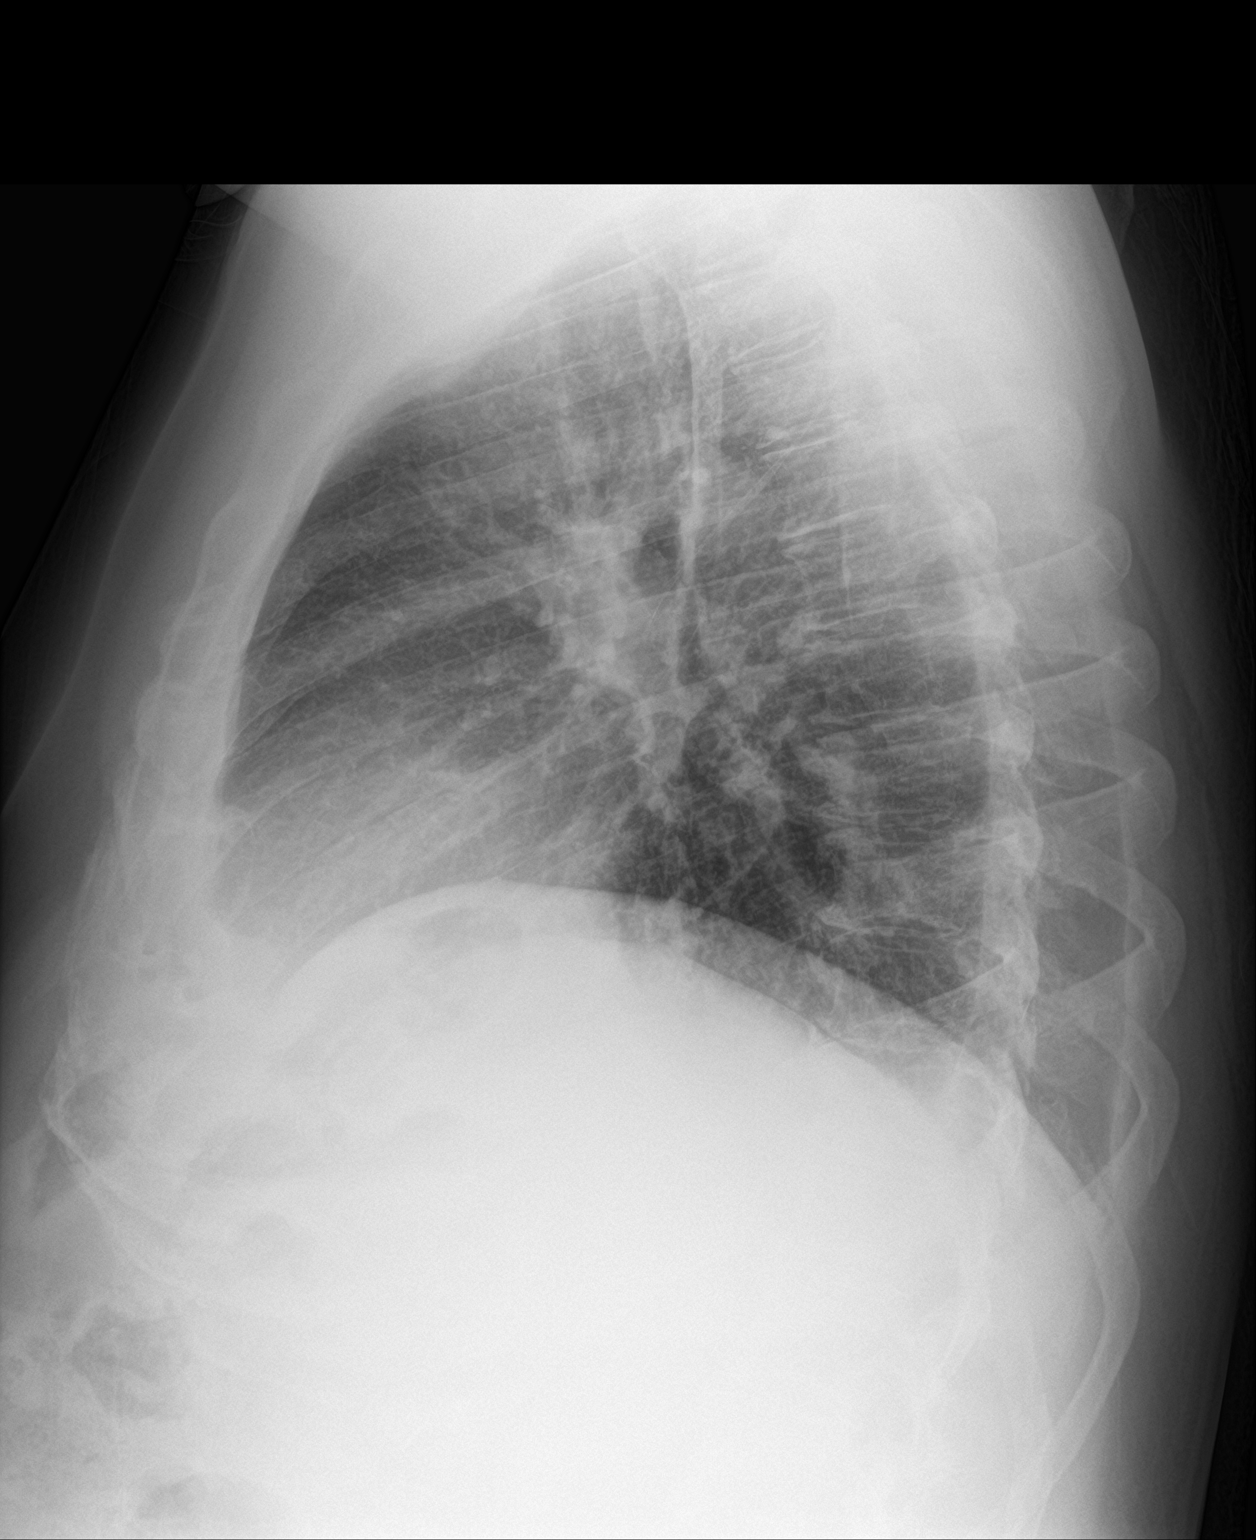

[2 of 2 positions shown; findings below may reference images not displayed]

FINDINGS: The heart size and mediastinal contours are within normal limits.
Both lungs are clear. The visualized skeletal structures are
unremarkable.
IMPRESSION: No active cardiopulmonary disease.

## 2022-07-23 DIAGNOSIS — F320 Major depressive disorder, single episode, mild: Secondary | ICD-10-CM | POA: Diagnosis not present

## 2022-08-06 DIAGNOSIS — F320 Major depressive disorder, single episode, mild: Secondary | ICD-10-CM | POA: Diagnosis not present

## 2022-08-07 DIAGNOSIS — H43812 Vitreous degeneration, left eye: Secondary | ICD-10-CM | POA: Diagnosis not present

## 2022-08-07 DIAGNOSIS — H33001 Unspecified retinal detachment with retinal break, right eye: Secondary | ICD-10-CM | POA: Diagnosis not present

## 2022-08-07 DIAGNOSIS — F320 Major depressive disorder, single episode, mild: Secondary | ICD-10-CM | POA: Diagnosis not present

## 2022-08-07 DIAGNOSIS — H25811 Combined forms of age-related cataract, right eye: Secondary | ICD-10-CM | POA: Diagnosis not present

## 2022-08-07 DIAGNOSIS — H40023 Open angle with borderline findings, high risk, bilateral: Secondary | ICD-10-CM | POA: Diagnosis not present

## 2022-08-07 DIAGNOSIS — Z8669 Personal history of other diseases of the nervous system and sense organs: Secondary | ICD-10-CM | POA: Diagnosis not present

## 2022-08-13 DIAGNOSIS — F320 Major depressive disorder, single episode, mild: Secondary | ICD-10-CM | POA: Diagnosis not present

## 2022-08-20 DIAGNOSIS — F320 Major depressive disorder, single episode, mild: Secondary | ICD-10-CM | POA: Diagnosis not present

## 2022-08-25 ENCOUNTER — Other Ambulatory Visit (HOSPITAL_COMMUNITY): Payer: Self-pay

## 2022-08-25 DIAGNOSIS — E785 Hyperlipidemia, unspecified: Secondary | ICD-10-CM | POA: Diagnosis not present

## 2022-08-25 DIAGNOSIS — Z713 Dietary counseling and surveillance: Secondary | ICD-10-CM | POA: Diagnosis not present

## 2022-08-25 DIAGNOSIS — Z6841 Body Mass Index (BMI) 40.0 and over, adult: Secondary | ICD-10-CM | POA: Diagnosis not present

## 2022-08-25 MED ORDER — ZEPBOUND 5 MG/0.5ML ~~LOC~~ SOAJ
5.0000 mg | SUBCUTANEOUS | 0 refills | Status: DC
  Filled 2022-08-25: qty 2, 28d supply, fill #0

## 2022-08-25 MED ORDER — ZEPBOUND 7.5 MG/0.5ML ~~LOC~~ SOAJ
7.5000 mg | SUBCUTANEOUS | 0 refills | Status: DC
  Filled 2022-08-25: qty 2, 28d supply, fill #0

## 2022-08-26 DIAGNOSIS — H33312 Horseshoe tear of retina without detachment, left eye: Secondary | ICD-10-CM | POA: Diagnosis not present

## 2022-08-26 DIAGNOSIS — Z8669 Personal history of other diseases of the nervous system and sense organs: Secondary | ICD-10-CM | POA: Diagnosis not present

## 2022-08-26 DIAGNOSIS — H3321 Serous retinal detachment, right eye: Secondary | ICD-10-CM | POA: Diagnosis not present

## 2022-08-26 DIAGNOSIS — H43812 Vitreous degeneration, left eye: Secondary | ICD-10-CM | POA: Diagnosis not present

## 2022-08-27 DIAGNOSIS — F320 Major depressive disorder, single episode, mild: Secondary | ICD-10-CM | POA: Diagnosis not present

## 2022-08-29 DIAGNOSIS — H40052 Ocular hypertension, left eye: Secondary | ICD-10-CM | POA: Diagnosis not present

## 2022-08-29 DIAGNOSIS — Z7985 Long-term (current) use of injectable non-insulin antidiabetic drugs: Secondary | ICD-10-CM | POA: Diagnosis not present

## 2022-08-29 DIAGNOSIS — Z6841 Body Mass Index (BMI) 40.0 and over, adult: Secondary | ICD-10-CM | POA: Diagnosis not present

## 2022-08-29 DIAGNOSIS — Z8614 Personal history of Methicillin resistant Staphylococcus aureus infection: Secondary | ICD-10-CM | POA: Diagnosis not present

## 2022-08-29 DIAGNOSIS — H2511 Age-related nuclear cataract, right eye: Secondary | ICD-10-CM | POA: Diagnosis not present

## 2022-08-29 DIAGNOSIS — Z79899 Other long term (current) drug therapy: Secondary | ICD-10-CM | POA: Diagnosis not present

## 2022-09-17 DIAGNOSIS — F320 Major depressive disorder, single episode, mild: Secondary | ICD-10-CM | POA: Diagnosis not present

## 2022-09-20 ENCOUNTER — Other Ambulatory Visit (HOSPITAL_COMMUNITY): Payer: Self-pay

## 2022-09-20 MED ORDER — ZEPBOUND 7.5 MG/0.5ML ~~LOC~~ SOAJ
7.5000 mg | SUBCUTANEOUS | 0 refills | Status: DC
  Filled 2022-09-20: qty 2, 28d supply, fill #0

## 2022-09-22 ENCOUNTER — Other Ambulatory Visit (HOSPITAL_COMMUNITY): Payer: Self-pay

## 2022-10-22 DIAGNOSIS — F320 Major depressive disorder, single episode, mild: Secondary | ICD-10-CM | POA: Diagnosis not present

## 2022-10-29 DIAGNOSIS — F320 Major depressive disorder, single episode, mild: Secondary | ICD-10-CM | POA: Diagnosis not present

## 2022-11-05 DIAGNOSIS — F320 Major depressive disorder, single episode, mild: Secondary | ICD-10-CM | POA: Diagnosis not present

## 2022-11-12 DIAGNOSIS — F320 Major depressive disorder, single episode, mild: Secondary | ICD-10-CM | POA: Diagnosis not present

## 2022-11-19 DIAGNOSIS — F320 Major depressive disorder, single episode, mild: Secondary | ICD-10-CM | POA: Diagnosis not present

## 2022-12-23 DIAGNOSIS — H43812 Vitreous degeneration, left eye: Secondary | ICD-10-CM | POA: Diagnosis not present

## 2022-12-23 DIAGNOSIS — H33312 Horseshoe tear of retina without detachment, left eye: Secondary | ICD-10-CM | POA: Diagnosis not present

## 2022-12-23 DIAGNOSIS — Z8669 Personal history of other diseases of the nervous system and sense organs: Secondary | ICD-10-CM | POA: Diagnosis not present

## 2022-12-28 DIAGNOSIS — J069 Acute upper respiratory infection, unspecified: Secondary | ICD-10-CM | POA: Diagnosis not present

## 2022-12-28 DIAGNOSIS — J40 Bronchitis, not specified as acute or chronic: Secondary | ICD-10-CM | POA: Diagnosis not present

## 2022-12-31 DIAGNOSIS — F320 Major depressive disorder, single episode, mild: Secondary | ICD-10-CM | POA: Diagnosis not present

## 2023-01-07 DIAGNOSIS — F320 Major depressive disorder, single episode, mild: Secondary | ICD-10-CM | POA: Diagnosis not present

## 2023-01-14 DIAGNOSIS — F320 Major depressive disorder, single episode, mild: Secondary | ICD-10-CM | POA: Diagnosis not present

## 2023-01-21 DIAGNOSIS — F320 Major depressive disorder, single episode, mild: Secondary | ICD-10-CM | POA: Diagnosis not present

## 2023-02-04 DIAGNOSIS — F320 Major depressive disorder, single episode, mild: Secondary | ICD-10-CM | POA: Diagnosis not present

## 2023-02-10 DIAGNOSIS — F320 Major depressive disorder, single episode, mild: Secondary | ICD-10-CM | POA: Diagnosis not present

## 2023-02-18 DIAGNOSIS — F320 Major depressive disorder, single episode, mild: Secondary | ICD-10-CM | POA: Diagnosis not present

## 2023-02-25 DIAGNOSIS — F320 Major depressive disorder, single episode, mild: Secondary | ICD-10-CM | POA: Diagnosis not present

## 2023-03-04 DIAGNOSIS — F320 Major depressive disorder, single episode, mild: Secondary | ICD-10-CM | POA: Diagnosis not present

## 2023-03-09 DIAGNOSIS — F320 Major depressive disorder, single episode, mild: Secondary | ICD-10-CM | POA: Diagnosis not present

## 2023-03-16 DIAGNOSIS — F320 Major depressive disorder, single episode, mild: Secondary | ICD-10-CM | POA: Diagnosis not present

## 2023-04-22 DIAGNOSIS — F320 Major depressive disorder, single episode, mild: Secondary | ICD-10-CM | POA: Diagnosis not present

## 2023-04-29 DIAGNOSIS — F320 Major depressive disorder, single episode, mild: Secondary | ICD-10-CM | POA: Diagnosis not present

## 2023-05-05 DIAGNOSIS — H33312 Horseshoe tear of retina without detachment, left eye: Secondary | ICD-10-CM | POA: Diagnosis not present

## 2023-05-05 DIAGNOSIS — H43812 Vitreous degeneration, left eye: Secondary | ICD-10-CM | POA: Diagnosis not present

## 2023-05-05 DIAGNOSIS — Z961 Presence of intraocular lens: Secondary | ICD-10-CM | POA: Diagnosis not present

## 2023-05-05 DIAGNOSIS — Z8669 Personal history of other diseases of the nervous system and sense organs: Secondary | ICD-10-CM | POA: Diagnosis not present

## 2023-05-06 DIAGNOSIS — F320 Major depressive disorder, single episode, mild: Secondary | ICD-10-CM | POA: Diagnosis not present

## 2023-05-20 DIAGNOSIS — F320 Major depressive disorder, single episode, mild: Secondary | ICD-10-CM | POA: Diagnosis not present

## 2023-05-27 DIAGNOSIS — F320 Major depressive disorder, single episode, mild: Secondary | ICD-10-CM | POA: Diagnosis not present

## 2023-06-03 DIAGNOSIS — F320 Major depressive disorder, single episode, mild: Secondary | ICD-10-CM | POA: Diagnosis not present

## 2023-06-10 DIAGNOSIS — F320 Major depressive disorder, single episode, mild: Secondary | ICD-10-CM | POA: Diagnosis not present

## 2023-06-17 DIAGNOSIS — F320 Major depressive disorder, single episode, mild: Secondary | ICD-10-CM | POA: Diagnosis not present

## 2023-06-24 DIAGNOSIS — F320 Major depressive disorder, single episode, mild: Secondary | ICD-10-CM | POA: Diagnosis not present

## 2023-07-01 DIAGNOSIS — F320 Major depressive disorder, single episode, mild: Secondary | ICD-10-CM | POA: Diagnosis not present

## 2023-07-08 DIAGNOSIS — F320 Major depressive disorder, single episode, mild: Secondary | ICD-10-CM | POA: Diagnosis not present

## 2023-07-15 DIAGNOSIS — F320 Major depressive disorder, single episode, mild: Secondary | ICD-10-CM | POA: Diagnosis not present

## 2023-07-22 DIAGNOSIS — F320 Major depressive disorder, single episode, mild: Secondary | ICD-10-CM | POA: Diagnosis not present

## 2023-08-05 DIAGNOSIS — F320 Major depressive disorder, single episode, mild: Secondary | ICD-10-CM | POA: Diagnosis not present

## 2023-08-12 DIAGNOSIS — F320 Major depressive disorder, single episode, mild: Secondary | ICD-10-CM | POA: Diagnosis not present

## 2023-08-19 DIAGNOSIS — F320 Major depressive disorder, single episode, mild: Secondary | ICD-10-CM | POA: Diagnosis not present

## 2023-08-26 DIAGNOSIS — F320 Major depressive disorder, single episode, mild: Secondary | ICD-10-CM | POA: Diagnosis not present

## 2023-08-31 ENCOUNTER — Ambulatory Visit
Admission: RE | Admit: 2023-08-31 | Discharge: 2023-08-31 | Disposition: A | Discharge status: Home / Self Care | Source: Ambulatory Visit

## 2023-08-31 VITALS — BP 130/73 | HR 74 | Temp 97.6°F | Resp 16

## 2023-08-31 DIAGNOSIS — B349 Viral infection, unspecified: Secondary | ICD-10-CM

## 2023-08-31 DIAGNOSIS — T148XXA Other injury of unspecified body region, initial encounter: Secondary | ICD-10-CM

## 2023-08-31 MED ORDER — DICLOFENAC SODIUM 50 MG PO TBEC: 50.0000 mg | DELAYED_RELEASE_TABLET | Freq: Two times a day (BID) | ORAL | 1 refills | Status: DC

## 2023-08-31 MED ORDER — BACLOFEN 10 MG PO TABS: 10.0000 mg | ORAL_TABLET | Freq: Three times a day (TID) | ORAL | 0 refills | Status: DC

## 2023-08-31 MED ORDER — AZELASTINE HCL 0.1 % NA SOLN: 1.0000 | Freq: Two times a day (BID) | NASAL | 1 refills | Status: DC

## 2023-08-31 NOTE — ED Triage Notes (Signed)
 Pt c/o cough for 1 week. On Friday he began to have lower back pain.

## 2023-08-31 NOTE — ED Provider Notes (Signed)
 [facility]  Note:  This document was prepared using Dragon voice recognition software and may include unintentional dictation errors.  MRN: [id number] DOB: [birth year]  Subjective:   Richard [name] is a 53 y.o. male presenting for cough, nasal congestion x 1 week.  Patient denies fever, chest pain, shortness of breath, weakness, dizziness.  Patient states that he has been coughing so much that he sees pulled a muscle in his back.  Patient denies any past history of back recent trauma or injury to the lower back.  Patient been taking NyQuil with mild improvement of symptoms, however no resolution.  Patient denies taking any over-the-counter pain medication lower back pain.  Patient reports that cough is slightly worse at night when lying down.  Patient denies any known sick contacts.   No current facility-administered medications for this encounter.  Current Outpatient Medications:    azelastine (ASTELIN) 0.1 % nasal spray, Place 1 spray into both nostrils 2 (two) times daily. Use in each nostril as directed, Disp: 30 mL, Rfl: 1   baclofen (LIORESAL) 10 MG tablet, Take 1 tablet (10 mg total) by mouth 3 (three) times daily., Disp: 30 each, Rfl: 0   diclofenac (VOLTAREN) 50 MG EC tablet, Take 1 tablet (50 mg total) by mouth 2 (two) times daily., Disp: 30 tablet, Rfl: 1   dorzolamide-timolol (COSOPT) 2-0.5 % ophthalmic solution, Apply 1 drop to eye., Disp: , Rfl:    FIBER ADULT GUMMIES PO, Take by mouth., Disp: , Rfl:    Multiple Vitamin (MULTIVITAMIN) capsule, Take 1 capsule by mouth daily., Disp: , Rfl:    tirzepatide  (ZEPBOUND ) 7.5 MG/0.5ML Pen, Inject 7.5 mg into the skin every 7 (seven) days. (Patient not taking: Reported on 08/31/2023), Disp: 2 mL, Rfl: 0   tirzepatide  (ZEPBOUND ) 7.5 MG/0.5ML Pen, Inject 7.5 mg into the skin once a week. (Patient not taking: Reported on 08/31/2023), Disp: 2 mL, Rfl: 0   Vitamin D , Ergocalciferol , (DRISDOL ) 1.25 MG (50000 UNIT) CAPS capsule,  Take 1 capsule (50,000 Units total) by mouth every 7 (seven) days., Disp: 4 capsule, Rfl: 0   WEGOVY 2.4 MG/0.75ML SOAJ, Inject into the skin. (Patient not taking: Reported on 08/31/2023), Disp: , Rfl:    Allergies  Allergen Reactions   Ppd [Tuberculin Purified Protein Derivative] Other (See Comments)    + PPD NEG CXR 2/09    Past Medical History:  Diagnosis Date   Back pain    Depression    Diverticulosis    Gallbladder problem    Lactose intolerance    Medical history non-contributory    Overweight      Past Surgical History:  Procedure Laterality Date   cerebral ataxia     as a baby   CHOLECYSTECTOMY  1992   COSMETIC SURGERY Left    lipoma removal, arm/ forehead   NASAL SEPTOPLASTY W/ TURBINOPLASTY Bilateral 10/10/2015   Procedure: NASAL SEPTOPLASTY WITH BILATERAL TURBINATE REDUCTION;  Surgeon: Lenton Rail, MD;  Location: [facility] OR;  Service: ENT;  Laterality: Bilateral;   REPAIR OF COMPLEX TRACTION RETINAL DETACHMENT Right 2023   UVULECTOMY N/A 10/10/2015   Procedure: Lynetta Saran ASSISTED;  Surgeon: Lenton Rail, MD;  Location: [facility] OR;  Service: ENT;  Laterality: N/A;    Family History  Problem Relation Age of Onset   Diabetes Mother    Cancer Mother 25       breast, skin,lymphoma, deceased   Breast cancer Mother    Depression Mother    Obesity Mother  Cancer Father        prostate, skin   Prostate cancer Father    Sleep apnea Father    Depression Sister    Cancer Sister        sarcoidosis   Depression Brother    Esophageal cancer Neg Hx    Rectal cancer Neg Hx    Stomach cancer Neg Hx    Colon cancer Neg Hx    Colon polyps Neg Hx     Social History   Tobacco Use   Smoking status: Never   Smokeless tobacco: Never  Vaping Use   Vaping status: Never Used  Substance Use Topics   Alcohol use: No    Alcohol/week: 0.0 standard drinks of alcohol   Drug use: No    ROS Refer to HPI for ROS details.  Objective:    Vitals: BP 130/73 (BP  Location: Right Arm)   Pulse 74   Temp 97.6 F (36.4 C)   Resp 16   SpO2 92%   Physical Exam Vitals and nursing note reviewed.  Constitutional:      General: He is not in acute distress.    Appearance: Normal appearance. He is not ill-appearing or toxic-appearing.  HENT:     Head: Normocephalic.     Nose: Congestion present.     Mouth/Throat:     Mouth: Mucous membranes are moist.   Cardiovascular:     Rate and Rhythm: Normal rate and regular rhythm.  Pulmonary:     Effort: Pulmonary effort is normal. No respiratory distress.     Breath sounds: No stridor. No wheezing.  Chest:     Chest wall: No tenderness.   Skin:    General: Skin is warm and dry.     Capillary Refill: Capillary refill takes less than 2 seconds.   Neurological:     General: No focal deficit present.     Mental Status: He is alert and oriented to person, place, and time.   Psychiatric:        Mood and Affect: Mood normal.        Behavior: Behavior normal.     Procedures  No results found for this or any previous visit (from the past 24 hours).  Assessment and Plan :     Discharge Instructions       1. Acute viral syndrome (Primary) - azelastine (ASTELIN) 0.1 % nasal spray; Place 1 spray into both nostrils 2 (two) times daily. Use in each nostril as directed  Dispense: 30 mL; Refill: 1 -Continue to monitor symptoms for any change in severity if there is any escalation of current symptoms or development of new symptoms follow-up in ER for further evaluation and management.  2. Muscle strain - baclofen (LIORESAL) 10 MG tablet; Take 1 tablet (10 mg total) by mouth 3 (three) times daily.  Dispense: 30 each; Refill: 0 - diclofenac (VOLTAREN) 50 MG EC tablet; Take 1 tablet (50 mg total) by mouth 2 (two) times daily.  Dispense: 30 tablet; Refill: 1      Margette Sheldon, [place], [place] 08/31/23 [phone]

## 2023-08-31 NOTE — Discharge Instructions (Signed)
  1. Acute viral syndrome (Primary) - azelastine (ASTELIN) 0.1 % nasal spray; Place 1 spray into both nostrils 2 (two) times daily. Use in each nostril as directed  Dispense: 30 mL; Refill: 1 -Continue to monitor symptoms for any change in severity if there is any escalation of current symptoms or development of new symptoms follow-up in ER for further evaluation and management.  2. Muscle strain - baclofen (LIORESAL) 10 MG tablet; Take 1 tablet (10 mg total) by mouth 3 (three) times daily.  Dispense: 30 each; Refill: 0 - diclofenac (VOLTAREN) 50 MG EC tablet; Take 1 tablet (50 mg total) by mouth 2 (two) times daily.  Dispense: 30 tablet; Refill: 1

## 2023-09-02 DIAGNOSIS — F320 Major depressive disorder, single episode, mild: Secondary | ICD-10-CM | POA: Diagnosis not present

## 2023-09-03 ENCOUNTER — Ambulatory Visit: Admitting: Primary Care

## 2023-09-03 ENCOUNTER — Encounter: Payer: Self-pay | Admitting: Primary Care

## 2023-09-03 VITALS — BP 128/74 | HR 60 | Temp 97.1°F | Ht 72.0 in | Wt 296.0 lb

## 2023-09-03 DIAGNOSIS — Z6841 Body Mass Index (BMI) 40.0 and over, adult: Secondary | ICD-10-CM

## 2023-09-03 DIAGNOSIS — Z3009 Encounter for other general counseling and advice on contraception: Secondary | ICD-10-CM | POA: Diagnosis not present

## 2023-09-03 DIAGNOSIS — Z1211 Encounter for screening for malignant neoplasm of colon: Secondary | ICD-10-CM

## 2023-09-03 DIAGNOSIS — J069 Acute upper respiratory infection, unspecified: Secondary | ICD-10-CM | POA: Diagnosis not present

## 2023-09-03 DIAGNOSIS — E66813 Obesity, class 3: Secondary | ICD-10-CM

## 2023-09-03 HISTORY — DX: Obesity, class 3: E66.813

## 2023-09-03 NOTE — Assessment & Plan Note (Signed)
 Improving.  Reviewed urgent care notes. Continue conservative treatment.

## 2023-09-03 NOTE — Assessment & Plan Note (Signed)
Referral placed to Urology.  

## 2023-09-03 NOTE — Assessment & Plan Note (Signed)
 Discussed options.   As he is intolerant to GLP 1 agonist treatment will refer to [facility] in [place].

## 2023-09-03 NOTE — Patient Instructions (Signed)
 You will either be contacted via phone regarding your referral to urology and [facility], or you may receive a letter on your MyChart portal from our referral team with instructions for scheduling an appointment. Please let us  know if you have not been contacted by anyone within two weeks.  You will receive a phone call regarding the colonoscopy.  Schedule the physical when you are ready.  It was a pleasure to see you today!

## 2023-09-03 NOTE — Progress Notes (Signed)
 Subjective:    Patient ID: Richard [name], male    DOB: 1970/04/14, 53 y.o.   MRN: [id number]  Cough Pertinent negatives include no chills, fever, headaches or sore throat.    Richard [name] is a very pleasant 53 y.o. male history of sleep apnea, insulin  resistance, chronic nasal congestion, retinal detachment who presents today to discuss several concerns.  He is also needing a colonoscopy. Last colonoscopy was in 2021, due in 2024.  1) Acute Cough: Evaluated at urgent care on 08/31/2023 for a 1 week history of productive cough with nasal congestion.  He was diagnosed with acute viral syndrome and prescribed Astelin nasal spray, and baclofen and diclofenac for muscle strain from coughing.  Today he's feeling much better. He does have a residual cough which is no longer productive. He denies fevers, chills. He was able to sleep better last night.   2) Vasectomy Referral: He would like a vasectomy referral as he is now dating and wants to prevent pregnancy. He has 3 children and has no desire to have more children.   3) Class 3 Obesity: Chronic for years. Was previously managed on Wegovy and Zepbound  at different times but he could not tolerate the GI side effects. He is trying to watching his diet and exercise.   Body mass index is 40.14 kg/m.   Review of Systems  Constitutional:  Negative for chills and fever.  HENT:  Negative for congestion and sore throat.   Respiratory:  Positive for cough.   Neurological:  Negative for headaches.         Past Medical History:  Diagnosis Date   Back pain    Depression    Diverticulosis    Gallbladder problem    Lactose intolerance    Medical history non-contributory    Overweight     Social History   Socioeconomic History   Marital status: Divorced    Spouse name: Not on file   Number of children: Not on file   Years of education: Not on file   Highest education level: Not on file  Occupational History   Occupation: Automotive engineer Professor,  [employer] video production  Tobacco Use   Smoking status: Never   Smokeless tobacco: Never  Vaping Use   Vaping status: Never Used  Substance and Sexual Activity   Alcohol use: No    Alcohol/week: 0.0 standard drinks of alcohol   Drug use: No   Sexual activity: Yes    Partners: Female  Other Topics Concern   Not on file  Social History Narrative   Not on file   Social Drivers of Health   Financial Resource Strain: Not on file  Food Insecurity: Not on file  Transportation Needs: Not on file  Physical Activity: Not on file  Stress: Not on file  Social Connections: Not on file  Intimate Partner Violence: Not on file    Past Surgical History:  Procedure Laterality Date   cerebral ataxia     as a baby   CHOLECYSTECTOMY  1992   COSMETIC SURGERY Left    lipoma removal, arm/ forehead   NASAL SEPTOPLASTY W/ TURBINOPLASTY Bilateral 10/10/2015   Procedure: NASAL SEPTOPLASTY WITH BILATERAL TURBINATE REDUCTION;  Surgeon: Lenton Rail, MD;  Location: [facility] OR;  Service: ENT;  Laterality: Bilateral;   REPAIR OF COMPLEX TRACTION RETINAL DETACHMENT Right 2023   UVULECTOMY N/A 10/10/2015   Procedure: Lynetta Saran ASSISTED;  Surgeon: Lenton Rail, MD;  Location: [facility] OR;  Service: ENT;  Laterality: N/A;  Family History  Problem Relation Age of Onset   Diabetes Mother    Cancer Mother 68       breast, skin,lymphoma, deceased   Breast cancer Mother    Depression Mother    Obesity Mother    Cancer Father        prostate, skin   Prostate cancer Father    Sleep apnea Father    Depression Sister    Cancer Sister        sarcoidosis   Depression Brother    Esophageal cancer Neg Hx    Rectal cancer Neg Hx    Stomach cancer Neg Hx    Colon cancer Neg Hx    Colon polyps Neg Hx     Allergies  Allergen Reactions   Ppd [Tuberculin Purified Protein Derivative] Other (See Comments)    + PPD NEG CXR 2/09    Current Outpatient Medications on File Prior to Visit  Medication  Sig Dispense Refill   diclofenac (VOLTAREN) 50 MG EC tablet Take 1 tablet (50 mg total) by mouth 2 (two) times daily. 30 tablet 1   dorzolamide-timolol (COSOPT) 2-0.5 % ophthalmic solution Apply 1 drop to eye.     FIBER ADULT GUMMIES PO Take by mouth.     Vitamin D , Ergocalciferol , (DRISDOL ) 1.25 MG (50000 UNIT) CAPS capsule Take 1 capsule (50,000 Units total) by mouth every 7 (seven) days. 4 capsule 0   azelastine (ASTELIN) 0.1 % nasal spray Place 1 spray into both nostrils 2 (two) times daily. Use in each nostril as directed (Patient not taking: Reported on 09/03/2023) 30 mL 1   baclofen (LIORESAL) 10 MG tablet Take 1 tablet (10 mg total) by mouth 3 (three) times daily. (Patient not taking: Reported on 09/03/2023) 30 each 0   Multiple Vitamin (MULTIVITAMIN) capsule Take 1 capsule by mouth daily.     No current facility-administered medications on file prior to visit.    BP 128/74   Pulse 60   Temp (!) 97.1 F (36.2 C) (Temporal)   Ht 6' (1.829 m)   Wt 296 lb (134.3 kg)   SpO2 97%   BMI 40.14 kg/m  Objective:   Physical Exam  Cardiovascular:     Rate and Rhythm: Normal rate and regular rhythm.  Pulmonary:     Effort: Pulmonary effort is normal.     Breath sounds: Normal breath sounds.   Musculoskeletal:     Cervical back: Neck supple.   Skin:    General: Skin is warm and dry.   Neurological:     Mental Status: He is alert and oriented to person, place, and time.   Psychiatric:        Mood and Affect: Mood normal.           Assessment & Plan:  Class 3 severe obesity due to excess calories with body mass index (BMI) of 40.0 to 44.9 in adult Assessment & Plan: Discussed options.   As he is intolerant to GLP 1 agonist treatment will refer to [facility] in [place].  Orders: -     Amb Ref to [facility]  Vasectomy evaluation Assessment & Plan: Referral placed to Urology.   Orders: -     Ambulatory referral to  Urology  Screening for colon cancer -     Ambulatory referral to Gastroenterology  Viral URI with cough Assessment & Plan: Improving.  Reviewed urgent care notes. Continue conservative treatment.  Takasha Vetere K Shalita Notte, NP

## 2023-09-04 ENCOUNTER — Encounter: Payer: Self-pay | Admitting: *Deleted

## 2023-09-10 ENCOUNTER — Encounter (INDEPENDENT_AMBULATORY_CARE_PROVIDER_SITE_OTHER): Payer: Self-pay

## 2023-09-30 DIAGNOSIS — F320 Major depressive disorder, single episode, mild: Secondary | ICD-10-CM | POA: Diagnosis not present

## 2023-10-01 ENCOUNTER — Institutional Professional Consult (permissible substitution): Admitting: Nurse Practitioner

## 2023-10-01 DIAGNOSIS — J019 Acute sinusitis, unspecified: Secondary | ICD-10-CM | POA: Diagnosis not present

## 2023-10-01 DIAGNOSIS — R051 Acute cough: Secondary | ICD-10-CM | POA: Diagnosis not present

## 2023-10-05 ENCOUNTER — Encounter: Payer: Self-pay | Admitting: Nurse Practitioner

## 2023-10-05 ENCOUNTER — Ambulatory Visit: Admitting: Nurse Practitioner

## 2023-10-05 VITALS — BP 136/83 | HR 68 | Temp 98.1°F | Ht 72.0 in | Wt 295.0 lb

## 2023-10-05 DIAGNOSIS — E559 Vitamin D deficiency, unspecified: Secondary | ICD-10-CM

## 2023-10-05 DIAGNOSIS — Z6841 Body Mass Index (BMI) 40.0 and over, adult: Secondary | ICD-10-CM | POA: Diagnosis not present

## 2023-10-05 DIAGNOSIS — E66813 Obesity, class 3: Secondary | ICD-10-CM

## 2023-10-05 DIAGNOSIS — E88819 Insulin resistance, unspecified: Secondary | ICD-10-CM | POA: Diagnosis not present

## 2023-10-05 NOTE — Progress Notes (Signed)
 Office: [phone]  /  Fax: [phone]   Initial Visit  Richard [name] was seen in clinic today to evaluate for obesity. He is interested in losing weight to improve overall health and reduce the risk of weight related complications. He presents today to review program treatment options, initial physical assessment, and evaluation.     He was referred by: PCP  He saw Dr. Delores last on 03/20/20. Back here today stating he needs more guidance and plans/goals.     When asked what else they would like to accomplish? He states: Adopt a healthier eating pattern and lifestyle, Improve energy levels and physical activity, Improve existing medical conditions, and Improve quality of life  When asked how has your weight affected you? He states: Having fatigue and Having poor endurance  Some associated conditions:  OSAS not on CPAP, insulin  resistance, Vit d def, HLD  Contributing factors: family history of obesity, multiple weight loss attempts in the past, sedentary job, and hectic pace of life  Weight promoting medications identified: None  Current nutrition plan: None  Current level of physical activity: None  Current or previous pharmacotherapy: GLP-1-Wegovy, Zepbound  and Saxenda-stopped due to side effects of diarrhea  Response to medication: Lost weight initially but was unable to sustain weight loss   Past medical history includes:   Past Medical History:  Diagnosis Date   Back pain    Depression    Diverticulosis    Gallbladder problem    Lactose intolerance    Medical history non-contributory    Overweight      Objective:   BP 136/83   Pulse 68   Temp 98.1 F (36.7 C)   Ht 6' (1.829 m)   Wt 295 lb (133.8 kg)   SpO2 98%   BMI 40.01 kg/m  He was weighed on the bioimpedance scale: Body mass index is 40.01 kg/m.  Peak Weight:325 lbs , Body Fat%:34.3%, Visceral Fat Rating:21, Weight trend over the last 12 months: Unchanged  General:  Alert, oriented and cooperative.  Patient is in no acute distress.  Respiratory: Normal respiratory effort, no problems with respiration noted   Gait: able to ambulate independently  Mental Status: Normal mood and affect. Normal behavior. Normal judgment and thought content.   DIAGNOSTIC DATA REVIEWED:  BMET    Component Value Date/Time   NA 143 11/09/2019 0847   K 4.7 11/09/2019 0847   CL 104 11/09/2019 0847   CO2 26 11/09/2019 0847   GLUCOSE 104 (H) 11/09/2019 0847   GLUCOSE 108 (H) 01/11/2019 0842   BUN 17 11/09/2019 0847   CREATININE 1.20 11/09/2019 0847   CREATININE 1.00 07/07/2013 1504   CALCIUM 9.5 11/09/2019 0847   GFRNONAA 70 11/09/2019 0847   GFRNONAA >89 07/07/2013 1504   GFRAA 81 11/09/2019 0847   GFRAA >89 07/07/2013 1504   Lab Results  Component Value Date   HGBA1C 5.3 11/09/2019   HGBA1C 5.6 07/07/2013   Lab Results  Component Value Date   INSULIN  17.4 11/09/2019   CBC    Component Value Date/Time   WBC 7.0 01/11/2019 0842   RBC 4.93 01/11/2019 0842   HGB 15.0 01/11/2019 0842   HGB 14.9 10/19/2015 1311   HCT 43.0 01/11/2019 0842   HCT 43.2 10/19/2015 1311   PLT 215.0 01/11/2019 0842   PLT 288 10/19/2015 1311   MCV 87.3 01/11/2019 0842   MCV 86 10/19/2015 1311   MCH 29.8 10/19/2015 1311   MCH 29.1 10/03/2015 1550   MCHC 34.8 01/11/2019  0842   RDW 13.7 01/11/2019 0842   RDW 13.2 10/19/2015 1311   Iron/TIBC/Ferritin/ %Sat No results found for: IRON, TIBC, FERRITIN, IRONPCTSAT Lipid Panel     Component Value Date/Time   CHOL 181 11/09/2019 0847   TRIG 171 (H) 11/09/2019 0847   HDL 34 (L) 11/09/2019 0847   CHOLHDL 5 01/11/2019 0842   VLDL 30.2 01/11/2019 0842   LDLCALC 117 (H) 11/09/2019 0847   LDLDIRECT 70.0 12/15/2017 0752   Hepatic Function Panel     Component Value Date/Time   PROT 6.2 11/09/2019 0847   ALBUMIN 4.4 11/09/2019 0847   AST 25 11/09/2019 0847   ALT 43 11/09/2019 0847   ALKPHOS 77 11/09/2019 0847   BILITOT 0.6 11/09/2019 0847   BILIDIR 0.0  05/24/2018 1320   BILIDIR 0.25 10/19/2015 1311   IBILI 0.7 07/07/2013 1504      Component Value Date/Time   TSH 2.630 11/09/2019 0847     Assessment and Plan:   Vitamin D  deficiency Will obtain labs at next visit.   Insulin  resistance Will obtain fasting labs at next visit  Obesity, Class III, BMI 40-49.9 (morbid obesity)        Obesity Treatment / Action Plan:  Patient will work on garnering support from family and friends to begin weight loss journey. Will work on eliminating or reducing the presence of highly palatable, calorie dense foods in the home. Will complete provided nutritional and psychosocial assessment questionnaire before the next appointment. Will be scheduled for indirect calorimetry to determine resting energy expenditure in a fasting state.  This will allow us  to create a reduced calorie, high-protein meal plan to promote loss of fat mass while preserving muscle mass. Counseled on the health benefits of losing 5%-15% of total body weight. Was counseled on nutritional approaches to weight loss and benefits of reducing processed foods and consuming plant-based foods and high quality protein as part of nutritional weight management. Was counseled on pharmacotherapy and role as an adjunct in weight management.   Obesity Education Performed Today:  He was weighed on the bioimpedance scale and results were discussed and documented in the synopsis.  We discussed obesity as a disease and the importance of a more detailed evaluation of all the factors contributing to the disease.  We discussed the importance of long term lifestyle changes which include nutrition, exercise and behavioral modifications as well as the importance of customizing this to his specific health and social needs.  We discussed the benefits of reaching a healthier weight to alleviate the symptoms of existing conditions and reduce the risks of the biomechanical, metabolic and psychological  effects of obesity.  Richard [name] appears to be in the action stage of change and states they are ready to start intensive lifestyle modifications and behavioral modifications.  30 minutes was spent today on this visit including the above counseling, pre-visit chart review, and post-visit documentation.  Reviewed by clinician on day of visit: allergies, medications, problem list, medical history, surgical history, family history, social history, and previous encounter notes pertinent to obesity diagnosis.    Corean SAUNDERS Alka Falwell FNP-C

## 2023-10-07 DIAGNOSIS — F320 Major depressive disorder, single episode, mild: Secondary | ICD-10-CM | POA: Diagnosis not present

## 2023-10-07 DIAGNOSIS — Z961 Presence of intraocular lens: Secondary | ICD-10-CM | POA: Diagnosis not present

## 2023-10-07 DIAGNOSIS — H40023 Open angle with borderline findings, high risk, bilateral: Secondary | ICD-10-CM | POA: Diagnosis not present

## 2023-10-07 DIAGNOSIS — Z8669 Personal history of other diseases of the nervous system and sense organs: Secondary | ICD-10-CM | POA: Diagnosis not present

## 2023-10-14 DIAGNOSIS — F320 Major depressive disorder, single episode, mild: Secondary | ICD-10-CM | POA: Diagnosis not present

## 2023-10-27 DIAGNOSIS — Z3009 Encounter for other general counseling and advice on contraception: Secondary | ICD-10-CM | POA: Diagnosis not present

## 2023-10-28 DIAGNOSIS — F320 Major depressive disorder, single episode, mild: Secondary | ICD-10-CM | POA: Diagnosis not present

## 2023-11-04 DIAGNOSIS — F320 Major depressive disorder, single episode, mild: Secondary | ICD-10-CM | POA: Diagnosis not present

## 2023-11-11 DIAGNOSIS — F320 Major depressive disorder, single episode, mild: Secondary | ICD-10-CM | POA: Diagnosis not present

## 2023-11-18 DIAGNOSIS — F320 Major depressive disorder, single episode, mild: Secondary | ICD-10-CM | POA: Diagnosis not present

## 2023-11-25 DIAGNOSIS — F320 Major depressive disorder, single episode, mild: Secondary | ICD-10-CM | POA: Diagnosis not present

## 2023-12-09 DIAGNOSIS — F320 Major depressive disorder, single episode, mild: Secondary | ICD-10-CM | POA: Diagnosis not present

## 2023-12-15 ENCOUNTER — Encounter: Payer: Self-pay | Admitting: Gastroenterology

## 2023-12-15 ENCOUNTER — Ambulatory Visit: Admitting: Gastroenterology

## 2023-12-15 VITALS — BP 136/82 | HR 67 | Ht 72.0 in | Wt 303.0 lb

## 2023-12-15 DIAGNOSIS — Z1211 Encounter for screening for malignant neoplasm of colon: Secondary | ICD-10-CM | POA: Diagnosis not present

## 2023-12-15 DIAGNOSIS — Z860100 Personal history of colon polyps, unspecified: Secondary | ICD-10-CM | POA: Diagnosis not present

## 2023-12-15 MED ORDER — NA SULFATE-K SULFATE-MG SULF 17.5-3.13-1.6 GM/177ML PO SOLN: 1.0000 | Freq: Once | ORAL | 0 refills | Status: AC

## 2023-12-15 NOTE — Patient Instructions (Signed)
 You have been scheduled for a colonoscopy. Please follow written instructions given to you at your visit today.   If you use inhalers (even only as needed), please bring them with you on the day of your procedure.  DO NOT TAKE 7 DAYS PRIOR TO TEST- Trulicity (dulaglutide) Ozempic, Wegovy (semaglutide) Mounjaro  (tirzepatide ) Bydureon Bcise (exanatide extended release)  DO NOT TAKE 1 DAY PRIOR TO YOUR TEST Rybelsus (semaglutide) Adlyxin (lixisenatide) Victoza (liraglutide) Byetta (exanatide) ___________________________________________________________________________   Thank you for entrusting me with your care and for choosing [facility], Dr. Elspeth Naval     _______________________________________________________  If your blood pressure at your visit was 140/90 or greater, please contact your primary care physician to follow up on this.  _______________________________________________________  If you are age 101 or older, your body mass index should be between 23-30. Your Body mass index is 41.09 kg/m. If this is out of the aforementioned range listed, please consider follow up with your Primary Care Provider.  If you are age 59 or younger, your body mass index should be between 19-25. Your Body mass index is 41.09 kg/m. If this is out of the aformentioned range listed, please consider follow up with your Primary Care Provider.   ________________________________________________________  The [facility] providers would like to encourage you to use MYCHART to communicate with providers for non-urgent requests or questions.  Due to long hold times on the telephone, sending your provider a message by [facility] may be a faster and more efficient way to get a response.  Please allow 48 business hours for a response.  Please remember that this is for non-urgent requests.  _______________________________________________________  [facility] is using a team-based  approach to care.  Your team is made up of your doctor and two to three APPS. Our APPS (Nurse Practitioners and Physician Assistants) work with your physician to ensure care continuity for you. They are fully qualified to address your health concerns and develop a treatment plan. They communicate directly with your gastroenterologist to care for you. Seeing the Advanced Practice Practitioners on your physician's team can help you by facilitating care more promptly, often allowing for earlier appointments, access to diagnostic testing, procedures, and other specialty referrals.

## 2023-12-15 NOTE — Progress Notes (Signed)
 HPI :  53 y/o male with a history of colon polyps, obesity, here to reestablish care and discuss surveillance colonoscopy.  His last exam was done in February 2021, 4 polyps removed, 1 adenoma and 1 traditional serrated adenoma.  Based on the traditional serrated adenoma it was recommended that he have a repeat exam in 3 years.  He is overdue for colonoscopy.  He denies any problems with his bowels at all that bother him.  No blood in his stools.  No abdominal pains.  No unexpected weight loss.  He has no family history of colon cancer.  His father did have prostate cancer.  He denies any cardiopulmonary symptoms.  States he actually does not have sleep apnea, noted in his chart, states he tested negative for it, does not wear CPAP.  Otherwise feels well without complaints today wants to proceed with colonoscopy    Colonoscopy 04/21/19: - The perianal and digital rectal examinations were normal. - Two (one flat and one sessile) polyps were found in the ascending colon. The polyps were 3 mm in size. These polyps were removed with a cold snare. Resection and retrieval were complete. - Two sessile polyps were found in the sigmoid colon. The polyps were 4 to 5 mm in size. These polyps were removed with a cold snare. Resection and retrieval were complete. - Internal hemorrhoids were found during retroflexion. The hemorrhoids were small. - The exam was otherwise without abnormality.  Diagnosis 1. Surgical [P], colon, ascending, polyp (2) - TUBULAR ADENOMA WITHOUT HIGH-GRADE DYSPLASIA OR MALIGNANCY - DIMINUTIVE HYPERPLASTIC POLYP 2. Surgical [P], colon, sigmoid, polyp (2) - SMALL TRADITIONAL SERRATED ADENOMA WITHOUT HIGH-GRADE DYSPLASIA - HYPERPLASTIC POLYP  Past Medical History:  Diagnosis Date   Back pain    Class 3 severe obesity due to excess calories with body mass index (BMI) of 40.0 to 44.9 in adult 09/03/2023   Depression    Diverticulosis    Family history of prostate cancer in father  01/18/2019   Gallbladder problem    Glaucoma    Hyperlipidemia    Lactose intolerance    Medical history non-contributory    Overweight    Sleep apnea 10/10/2015   Vitamin D  deficiency      Past Surgical History:  Procedure Laterality Date   cerebral ataxia     as a baby   CHOLECYSTECTOMY  1992   COSMETIC SURGERY Left    lipoma removal, arm/ forehead   NASAL SEPTOPLASTY W/ TURBINOPLASTY Bilateral 10/10/2015   Procedure: NASAL SEPTOPLASTY WITH BILATERAL TURBINATE REDUCTION;  Surgeon: Marlyce Finer, MD;  Location: [facility] OR;  Service: ENT;  Laterality: Bilateral;   REPAIR OF COMPLEX TRACTION RETINAL DETACHMENT Right 2023   UVULECTOMY N/A 10/10/2015   Procedure: COLEEN DIEGO ASSISTED;  Surgeon: Marlyce Finer, MD;  Location: [facility] OR;  Service: ENT;  Laterality: N/A;   Family History  Problem Relation Age of Onset   Diabetes Mother    Cancer Mother 17       breast, skin,lymphoma, deceased   Breast cancer Mother    Depression Mother    Obesity Mother    Cancer Father        prostate, skin   Prostate cancer Father    Sleep apnea Father    Depression Sister    Cancer Sister        sarcoidosis   Depression Brother    Esophageal cancer Neg Hx    Rectal cancer Neg Hx    Stomach cancer Neg Hx  Colon cancer Neg Hx    Colon polyps Neg Hx    Social History   Tobacco Use   Smoking status: Never   Smokeless tobacco: Never  Vaping Use   Vaping status: Never Used  Substance Use Topics   Alcohol use: No    Alcohol/week: 0.0 standard drinks of alcohol   Drug use: No   Current Outpatient Medications  Medication Sig Dispense Refill   dorzolamide-timolol (COSOPT) 2-0.5 % ophthalmic solution Place 1 drop into both eyes 2 (two) times daily.     FIBER ADULT GUMMIES PO Take by mouth.     Multiple Vitamin (MULTIVITAMIN) capsule Take 1 capsule by mouth daily.     No current facility-administered medications for this visit.   Allergies  Allergen Reactions   Ppd [Tuberculin  Purified Protein Derivative] Other (See Comments)    + PPD NEG CXR 2/09     Review of Systems: All systems reviewed and negative except where noted in HPI.    No results found.  Physical Exam: BP 136/82   Pulse 67   Ht 6' (1.829 m)   Wt (!) 303 lb (137.4 kg)   SpO2 95%   BMI 41.09 kg/m  Constitutional: Pleasant,well-developed, male in no acute distress. HEENT: Normocephalic and atraumatic. Conjunctivae are normal. No scleral icterus. Neck supple.  Cardiovascular: Normal rate, regular rhythm.  Pulmonary/chest: Effort normal and breath sounds normal. No wheezing, rales or rhonchi. Abdominal: Soft, nondistended, nontender. There are no masses palpable.  Extremities: no edema Lymphadenopathy: No cervical adenopathy noted. Neurological: Alert and oriented to person place and time. Skin: Skin is warm and dry. No rashes noted. Psychiatric: Normal mood and affect. Behavior is normal.   ASSESSMENT: 53 y.o. male here for assessment of the following  1. History of colon polyps    Due for surveillance colonoscopy, history of traditional serrated adenoma in 2021.  Discussed risks and benefits of colonoscopy and anesthesia with him, he understands and wishes to proceed.  Further recommendations pending results.  Marcey Naval, MD [facility]  CC: Clark, Katherine K, NP

## 2023-12-16 DIAGNOSIS — F320 Major depressive disorder, single episode, mild: Secondary | ICD-10-CM | POA: Diagnosis not present

## 2023-12-18 DIAGNOSIS — Z302 Encounter for sterilization: Secondary | ICD-10-CM | POA: Diagnosis not present

## 2023-12-23 DIAGNOSIS — F320 Major depressive disorder, single episode, mild: Secondary | ICD-10-CM | POA: Diagnosis not present

## 2023-12-30 DIAGNOSIS — F320 Major depressive disorder, single episode, mild: Secondary | ICD-10-CM | POA: Diagnosis not present

## 2024-01-06 DIAGNOSIS — F320 Major depressive disorder, single episode, mild: Secondary | ICD-10-CM | POA: Diagnosis not present

## 2024-01-13 DIAGNOSIS — F320 Major depressive disorder, single episode, mild: Secondary | ICD-10-CM | POA: Diagnosis not present

## 2024-01-19 ENCOUNTER — Encounter: Admitting: Gastroenterology

## 2024-01-20 DIAGNOSIS — F320 Major depressive disorder, single episode, mild: Secondary | ICD-10-CM | POA: Diagnosis not present

## 2024-01-22 ENCOUNTER — Encounter: Payer: Self-pay | Admitting: Gastroenterology

## 2024-01-22 MED ORDER — NA SULFATE-K SULFATE-MG SULF 17.5-3.13-1.6 GM/177ML PO SOLN
ORAL | 0 refills | Status: AC
Start: 2024-01-22 — End: ?

## 2024-01-27 DIAGNOSIS — F320 Major depressive disorder, single episode, mild: Secondary | ICD-10-CM | POA: Diagnosis not present

## 2024-02-01 ENCOUNTER — Encounter: Payer: Self-pay | Admitting: Gastroenterology

## 2024-02-01 ENCOUNTER — Ambulatory Visit: Admitting: Gastroenterology

## 2024-02-01 VITALS — BP 157/92 | HR 66 | Temp 97.4°F | Resp 20 | Ht 72.0 in | Wt 303.0 lb

## 2024-02-01 DIAGNOSIS — K5730 Diverticulosis of large intestine without perforation or abscess without bleeding: Secondary | ICD-10-CM

## 2024-02-01 DIAGNOSIS — K648 Other hemorrhoids: Secondary | ICD-10-CM

## 2024-02-01 DIAGNOSIS — Z1211 Encounter for screening for malignant neoplasm of colon: Secondary | ICD-10-CM | POA: Diagnosis not present

## 2024-02-01 DIAGNOSIS — D122 Benign neoplasm of ascending colon: Secondary | ICD-10-CM

## 2024-02-01 DIAGNOSIS — Z860101 Personal history of adenomatous and serrated colon polyps: Secondary | ICD-10-CM

## 2024-02-01 DIAGNOSIS — K635 Polyp of colon: Secondary | ICD-10-CM

## 2024-02-01 DIAGNOSIS — Z860100 Personal history of colon polyps, unspecified: Secondary | ICD-10-CM

## 2024-02-01 DIAGNOSIS — D120 Benign neoplasm of cecum: Secondary | ICD-10-CM

## 2024-02-01 DIAGNOSIS — D123 Benign neoplasm of transverse colon: Secondary | ICD-10-CM | POA: Diagnosis not present

## 2024-02-01 MED ORDER — SODIUM CHLORIDE 0.9 % IV SOLN: 500.0000 mL | INTRAVENOUS | Status: DC

## 2024-02-01 NOTE — Progress Notes (Signed)
 Sedate, gd SR, tolerated procedure well, VSS, report to RN

## 2024-02-01 NOTE — Progress Notes (Signed)
 Richard [name] History and Physical   Primary Care Physician:  Richard Comer POUR, NP   Reason for Procedure:   History of colon polyps  Plan:    colonoscopy     HPI: Richard [name] is a 53 y.o. male  here for colonoscopy surveillance - history of polyps removed in the past, last exam 2021 - traditional serrated adenoma removed.   Patient denies any bowel symptoms at this time. No family history of colon cancer known. Otherwise feels well without any cardiopulmonary symptoms.   I have discussed risks / benefits of anesthesia and endoscopic procedure with Richard [name] and they wish to proceed with the exams as outlined today.   The patient was provided an opportunity to ask questions and all were answered. The patient agreed with the plan.    Past Medical History:  Diagnosis Date   Back pain    Cataract    Class 3 severe obesity due to excess calories with body mass index (BMI) of 40.0 to 44.9 in adult [facility]) 09/03/2023   Depression    Diverticulosis    Family history of prostate cancer in father 01/18/2019   Gallbladder problem    Glaucoma    Hyperlipidemia    Lactose intolerance    Medical history non-contributory    Overweight    Sleep apnea 10/10/2015   Vitamin D  deficiency     Past Surgical History:  Procedure Laterality Date   cerebral ataxia     as a baby   CHOLECYSTECTOMY  1992   COSMETIC SURGERY Left    lipoma removal, arm/ forehead   NASAL SEPTOPLASTY W/ TURBINOPLASTY Bilateral 10/10/2015   Procedure: NASAL SEPTOPLASTY WITH BILATERAL TURBINATE REDUCTION;  Surgeon: Richard Finer, MD;  Location: [facility] OR;  Service: ENT;  Laterality: Bilateral;   REPAIR OF COMPLEX TRACTION RETINAL DETACHMENT Right 2023   UVULECTOMY N/A 10/10/2015   Procedure: Richard [name] ASSISTED;  Surgeon: Richard Finer, MD;  Location: [facility] OR;  Service: ENT;  Laterality: N/A;    Prior to Admission medications   Medication Sig Start Date End Date Taking? Authorizing Provider  Na  Sulfate-K Sulfate-Mg Sulfate concentrate (SUPREP) 17.5-3.13-1.6 GM/177ML SOLN Use as directed; may use generic; goodrx card if insurance will not cover generic 01/22/24  Yes Richard [name], Richard SQUIBB, MD  dorzolamide-timolol (COSOPT) 2-0.5 % ophthalmic solution Place 1 drop into both eyes 2 (two) times daily. 10/07/23   [provider]  FIBER ADULT GUMMIES PO Take by mouth.    [provider]  Multiple Vitamin (MULTIVITAMIN) capsule Take 1 capsule by mouth daily.    [provider]    Current Outpatient Medications  Medication Sig Dispense Refill   Na Sulfate-K Sulfate-Mg Sulfate concentrate (SUPREP) 17.5-3.13-1.6 GM/177ML SOLN Use as directed; may use generic; goodrx card if insurance will not cover generic 354 mL 0   dorzolamide-timolol (COSOPT) 2-0.5 % ophthalmic solution Place 1 drop into both eyes 2 (two) times daily.     FIBER ADULT GUMMIES PO Take by mouth.     Multiple Vitamin (MULTIVITAMIN) capsule Take 1 capsule by mouth daily.     Current Facility-Administered Medications  Medication Dose Route Frequency Provider Last Rate Last Admin   0.9 %  sodium chloride  infusion  500 mL Intravenous Continuous Richard [name], Richard SQUIBB, MD        Allergies as of 02/01/2024 - Review Complete 02/01/2024  Allergen Reaction Noted   Ppd [tuberculin purified protein derivative] Other (See Comments) 02/02/2013    Family History  Problem Relation Age  of Onset   Diabetes Mother    Cancer Mother 28       breast, skin,lymphoma, deceased   Breast cancer Mother    Depression Mother    Obesity Mother    Cancer Father        prostate, skin   Prostate cancer Father    Sleep apnea Father    Depression Sister    Cancer Sister        sarcoidosis   Depression Brother    Esophageal cancer Neg Hx    Rectal cancer Neg Hx    Stomach cancer Neg Hx    Colon cancer Neg Hx    Colon polyps Neg Hx     Social History   Socioeconomic History   Marital status: Divorced    Spouse name:  Not on file   Number of children: 3   Years of education: Not on file   Highest education level: Not on file  Occupational History   Occupation: Automotive Engineer Professor, hydrologist  Tobacco Use   Smoking status: Never   Smokeless tobacco: Never  Vaping Use   Vaping status: Never Used  Substance and Sexual Activity   Alcohol use: No    Alcohol/week: 0.0 standard drinks of alcohol   Drug use: No   Sexual activity: Yes    Partners: Female  Other Topics Concern   Not on file  Social History Narrative   Not on file   Social Drivers of Health   Financial Resource Strain: Not on file  Food Insecurity: Not on file  Transportation Needs: Not on file  Physical Activity: Not on file  Stress: Not on file  Social Connections: Not on file  Intimate Partner Violence: Not on file    Review of Systems: All other review of systems negative except as mentioned in the HPI.  Physical Exam: Vital signs BP (!) 147/76   Pulse 68   Temp (!) 97.4 F (36.3 C) (Temporal)   Ht 6' (1.829 m)   Wt (!) 303 lb (137.4 kg)   SpO2 97%   BMI 41.09 kg/m   General:   Alert,  Well-developed, pleasant and cooperative in NAD Lungs:  Clear throughout to auscultation.   Heart:  Regular rate and rhythm Abdomen:  Soft, nontender and nondistended.   Neuro/Psych:  Alert and cooperative. Normal mood and affect. A and O x 3  Richard Naval, MD [name]

## 2024-02-01 NOTE — Op Note (Signed)
 [facility] Patient Name: Richard [name] Procedure Date: 02/01/2024 2:35 PM MRN: [id number] Endoscopist: Elspeth P. Leigh , MD, [id number] Age: 53 Referring MD:  Date of Birth: [birth year] Gender: Male Account #: [id number] Procedure:                Colonoscopy Indications:              High risk colon cancer surveillance: Personal                            history of colonic polyps - including traditional                            serrated adenoma removed 04/2019 Medicines:                Monitored Anesthesia Care Procedure:                Pre-Anesthesia Assessment:                           - Prior to the procedure, a History and Physical                            was performed, and patient medications and                            allergies were reviewed. The patient's tolerance of                            previous anesthesia was also reviewed. The risks                            and benefits of the procedure and the sedation                            options and risks were discussed with the patient.                            All questions were answered, and informed consent                            was obtained. Prior Anticoagulants: The patient has                            taken no anticoagulant or antiplatelet agents. ASA                            Grade Assessment: III - A patient with severe                            systemic disease. After reviewing the risks and                            benefits, the patient was deemed in satisfactory  condition to undergo the procedure.                           After obtaining informed consent, the colonoscope                            was passed under direct vision. Throughout the                            procedure, the patient's blood pressure, pulse, and                            oxygen saturations were monitored continuously. The                            CF [id number] #[id number] was  introduced through the anus                            and advanced to the the cecum, identified by                            appendiceal orifice and ileocecal valve. The                            colonoscopy was performed without difficulty. The                            patient tolerated the procedure well. The quality                            of the bowel preparation was good. The ileocecal                            valve, appendiceal orifice, and rectum were                            photographed. Scope In: 2:45:20 PM Scope Out: 3:06:42 PM Scope Withdrawal Time: 0 hours 18 minutes 30 seconds  Total Procedure Duration: 0 hours 21 minutes 22 seconds  Findings:                 The perianal and digital rectal examinations were                            normal.                           Two sessile polyps were found in the cecum. The                            polyps were 3 to 5 mm in size. These polyps were                            removed with a cold snare. Resection and retrieval  were complete.                           A 3 mm polyp was found in the ascending colon. The                            polyp was sessile. The polyp was removed with a                            cold snare. Resection and retrieval were complete.                           Two sessile polyps were found in the transverse                            colon. The polyps were 3 to 4 mm in size. These                            polyps were removed with a cold snare. Resection                            and retrieval were complete.                           A few small-mouthed diverticula were found in the                            sigmoid colon.                           Internal hemorrhoids were found during                            retroflexion. The hemorrhoids were small.                           The exam was otherwise without abnormality. Complications:            No  immediate complications. Estimated blood loss:                            Minimal. Estimated Blood Loss:     Estimated blood loss was minimal. Impression:               - Two 3 to 5 mm polyps in the cecum, removed with a                            cold snare. Resected and retrieved.                           - One 3 mm polyp in the ascending colon, removed                            with a cold snare. Resected and retrieved.                           -  Two 3 to 4 mm polyps in the transverse colon,                            removed with a cold snare. Resected and retrieved.                           - Diverticulosis in the sigmoid colon.                           - Internal hemorrhoids.                           - The examination was otherwise normal. Recommendation:           - Patient has a contact number available for                            emergencies. The signs and symptoms of potential                            delayed complications were discussed with the                            patient. Return to normal activities tomorrow.                            Written discharge instructions were provided to the                            patient.                           - Resume previous diet.                           - Continue present medications.                           - Await pathology results. Elspeth P. Richard Boyajian, MD 02/01/2024 3:13:02 PM This report has been signed electronically.

## 2024-02-01 NOTE — Patient Instructions (Signed)
 Resume previous diet. Continue present medications. Awaiting pathology results.  Handouts provided on polyps, diverticulosis, and hemorrhoids.   YOU HAD AN ENDOSCOPIC PROCEDURE TODAY AT THE [facility]:   Refer to the procedure report that was given to you for any specific questions about what was found during the examination.  If the procedure report does not answer your questions, please call your gastroenterologist to clarify.  If you requested that your care partner not be given the details of your procedure findings, then the procedure report has been included in a sealed envelope for you to review at your convenience later.  YOU SHOULD EXPECT: Some feelings of bloating in the abdomen. Passage of more gas than usual.  Walking can help get rid of the air that was put into your GI tract during the procedure and reduce the bloating. If you had a lower endoscopy (such as a colonoscopy or flexible sigmoidoscopy) you may notice spotting of blood in your stool or on the toilet paper. If you underwent a bowel prep for your procedure, you may not have a normal bowel movement for a few days.  Please Note:  You might notice some irritation and congestion in your nose or some drainage.  This is from the oxygen used during your procedure.  There is no need for concern and it should clear up in a day or so.  SYMPTOMS TO REPORT IMMEDIATELY:  Following lower endoscopy (colonoscopy or flexible sigmoidoscopy):  Excessive amounts of blood in the stool  Significant tenderness or worsening of abdominal pains  Swelling of the abdomen that is new, acute  Fever of 100F or higher  For urgent or emergent issues, a gastroenterologist can be reached at any hour by calling [phone]. Do not use MyChart messaging for urgent concerns.    DIET:  We do recommend a small meal at first, but then you may proceed to your regular diet.  Drink plenty of fluids but you should avoid alcoholic beverages for 24  hours.  ACTIVITY:  You should plan to take it easy for the rest of today and you should NOT DRIVE or use heavy machinery until tomorrow (because of the sedation medicines used during the test).    FOLLOW UP: Our staff will call the number listed on your records the next business day following your procedure.  We will call around 7:15- 8:00 am to check on you and address any questions or concerns that you may have regarding the information given to you following your procedure. If we do not reach you, we will leave a message.     If any biopsies were taken you will be contacted by phone or by letter within the next 1-3 weeks.  Please call us  at [phone] if you have not heard about the biopsies in 3 weeks.    SIGNATURES/CONFIDENTIALITY: You and/or your care partner have signed paperwork which will be entered into your electronic medical record.  These signatures attest to the fact that that the information above on your After Visit Summary has been reviewed and is understood.  Full responsibility of the confidentiality of this discharge information lies with you and/or your care-partner.

## 2024-02-02 ENCOUNTER — Telehealth: Payer: Self-pay

## 2024-02-02 NOTE — Telephone Encounter (Signed)
  Follow up Call-     02/01/2024    1:48 PM  Call back number  Post procedure Call Back phone  # [phone]  Permission to leave phone message Yes     Patient questions:  Do you have a fever, pain , or abdominal swelling? No. Pain Score  0 *  Have you tolerated food without any problems? Yes.    Have you been able to return to your normal activities? Yes.    Do you have any questions about your discharge instructions: Diet   No. Medications  No. Follow up visit  No.  Do you have questions or concerns about your Care? No.  Actions: * If pain score is 4 or above: No action needed, pain <4.

## 2024-02-03 DIAGNOSIS — F320 Major depressive disorder, single episode, mild: Secondary | ICD-10-CM | POA: Diagnosis not present

## 2024-02-04 ENCOUNTER — Ambulatory Visit: Payer: Self-pay | Admitting: Gastroenterology

## 2024-02-04 LAB — SURGICAL PATHOLOGY

## 2024-02-08 ENCOUNTER — Ambulatory Visit

## 2024-02-08 ENCOUNTER — Ambulatory Visit (INDEPENDENT_AMBULATORY_CARE_PROVIDER_SITE_OTHER)

## 2024-02-08 DIAGNOSIS — M545 Low back pain, unspecified: Secondary | ICD-10-CM

## 2024-02-08 MED ORDER — MELOXICAM 15 MG PO TABS: 15.0000 mg | ORAL_TABLET | Freq: Every day | ORAL | 0 refills | Status: AC

## 2024-02-08 NOTE — Progress Notes (Signed)
 Orthopaedic Surgery New Patient Visit   History of Present Illness: The patient is a 53 y.o. male seen in clinic for low back pain.  He reports symptoms started several weeks ago without any known trauma or injury.  He describes the pain as a tightness or spasm on both sides of his low back.  Symptoms are worsened with seated position.  Symptoms improved with walking and lying down flat.  He does note he had been working out with a trainer as of late and some of the exercises had been bothering his back.  Denies associated radiating pain to legs.  Denies fever or chills.  Denies saddle anesthesia.  Denies numbness or paresthesias in the lower extremities.  Has used over-the-counter Tylenol  and Goody's powder with minimal relief.  Previous history of physical therapy 5 years ago for his low back, did help.  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  Patient regularly followed by Comer Gaskins, NP with [facility].  Last seen on 09/03/2023.  Managed for obesity, sleep apnea, insulin  resistance, chronic nasal congestion, and retinal detachment.  Patient's last hemoglobin A1c 01/16/2021 was 5.2%.  Also 3 years ago BUN 17, creatinine 1.11, and estimated GFR 80.  Patient has previously been on Semaglutide and Tirzepatide  for weight loss; discontinued due to GI side-effects. Patient not currently on any DM2 medications.   Past Medical, Social and Family History: Past Medical History:  Diagnosis Date   Back pain    Cataract    Class 3 severe obesity due to excess calories with body mass index (BMI) of 40.0 to 44.9 in adult [facility]) 09/03/2023   Depression    Diverticulosis    Family history of prostate cancer in father 01/18/2019   Gallbladder problem    Glaucoma    Hyperlipidemia    Lactose intolerance    Medical history non-contributory    Overweight    Sleep apnea 10/10/2015   Vitamin D  deficiency    Past Surgical History:  Procedure Laterality Date   cerebral ataxia      as a baby   CHOLECYSTECTOMY  1992   COSMETIC SURGERY Left    lipoma removal, arm/ forehead   NASAL SEPTOPLASTY W/ TURBINOPLASTY Bilateral 10/10/2015   Procedure: NASAL SEPTOPLASTY WITH BILATERAL TURBINATE REDUCTION;  Surgeon: Marlyce Finer, MD;  Location: [facility] OR;  Service: ENT;  Laterality: Bilateral;   REPAIR OF COMPLEX TRACTION RETINAL DETACHMENT Right 2023   UVULECTOMY N/A 10/10/2015   Procedure: COLEEN DIEGO ASSISTED;  Surgeon: Marlyce Finer, MD;  Location: [facility] OR;  Service: ENT;  Laterality: N/A;   Allergies  Allergen Reactions   Ppd [Tuberculin Purified Protein Derivative] Other (See Comments)    + PPD NEG CXR 2/09   Current Outpatient Medications on File Prior to Visit  Medication Sig Dispense Refill   dorzolamide-timolol (COSOPT) 2-0.5 % ophthalmic solution Place 1 drop into both eyes 2 (two) times daily.     FIBER ADULT GUMMIES PO Take by mouth.     Multiple Vitamin (MULTIVITAMIN) capsule Take 1 capsule by mouth daily.     Na Sulfate-K Sulfate-Mg Sulfate concentrate (SUPREP) 17.5-3.13-1.6 GM/177ML SOLN Use as directed; may use generic; goodrx card if insurance will not cover generic 354 mL 0   No current facility-administered medications on file prior to visit.   Social History   Tobacco Use   Smoking status: Never   Smokeless tobacco: Never  Vaping Use   Vaping status: Never Used  Substance Use Topics   Alcohol  use: No    Alcohol/week: 0.0 standard drinks of alcohol   Drug use: No      I have reviewed past medical, surgical, social and family history, medications and allergies as documented in the EMR.  Review of Systems - A ROS was performed including pertinent positives and negatives as documented in the HPI.     Physical Exam:  General/Constitutional: NAD Vascular: No edema, swelling or tenderness, except as noted in detailed exam Integumentary: No impressive skin lesions present, except as noted in detailed exam Neuro/Psych: Normal mood and  affect, oriented to person, place and time Musculoskeletal: Normal, except as noted in detailed exam and in HPI   Focused Orthopaedic Examination:  MSK (spine): Back normal to inspection.  No obvious deformity or sign of trauma.  No tenderness along the midline lumbar spine.  Tenderness and hypertonicity about the paraspinal musculature adjacent to the lumbar spine bilaterally.  Nontender to palpation about the gluteal regions bilaterally.  Nontender about the SI joints.  -Strength exam      Left  Right  EHL    5/5  5/5 TA    5/5  5/5 GSC    5/5  5/5 Knee extension  5/5  5/5 Hip flexion   5/5  5/5  -Sensory exam    Sensation intact to light touch in L3-S1 nerve distributions of bilateral lower extremities   -Straight leg raise: Negative bilaterally -Negative FABER test bilaterally -Patient with normal arc of motion in bilateral hips without pain while seated and supine -Gait: No abnormality   -Vascular/Lymphatic: Bilateral lower extremities warm and well-perfused     XR Lumbar Spine Xray Imaging: X-rays of the lumbar spine including 2-views (AP and lateral) obtained today 02/08/2024 at [facility] were reviewed personally by me.  Per my independent interpretation these images show no acute fracture or dislocation.  Appropriate alignment. Mild anterior end plate spurring at L5-S1 with some loss of disc space at this level; slightly more prominent compared to lumbar spine xray from 07/14/2011.    Radiology Read: Lumbar Spine MRI without contrast on 07/14/2011 (12 years ago) IMPRESSION:   1. Mild foraminal narrowing bilaterally at L4-5.  This is the most  significant right-sided disease.  2.  Mild left lateral recess and foraminal stenosis at L4-5 and L5-  S1.  3. A small synovial cyst at the right L4-5 facet joint extends  under the ligamentum flavum.   Assessment:  Acute lumbar strain  Plan:  Patient was seen and examined in  office today. We reviewed patient's history, examination, and imaging in detail. Based on information available for this encounter, patient is likely symptomatic from acute lumbar strain.  Clinically, there is no midline tenderness within the lumbar spine.  However he does have hypertonicity and pain within bilateral paraspinal musculature adjacent the lumbar spine.  No evidence of weakness or sensory loss present on examination.  His radiographs suggest mild to moderate degenerative changes about L5-S1.  We discussed conservative treatment measures today including scheduled oral anti-inflammatory medication such as meloxicam  for 2 weeks and formal physical therapy.  Patient is interested in both treatment options today.  Referral has been placed for physical therapy in [place] per patient preference.  Home exercises also provided for patient today.  We would like to see patient back after the completion of physical therapy in roughly 8-10 weeks.    Patient education material was provided.  All questions, concerns and comments were addressed to the best of  my ability.  Follow-up: 8-10 weeks   Arlyss GEANNIE Schneider, DO Orthopedic Surgery & Sports Medicine [place]   This document was dictated using Dragon voice recognition software. A reasonable attempt at proof reading has been made to minimize errors.

## 2024-02-08 NOTE — Patient Instructions (Signed)

## 2024-02-10 DIAGNOSIS — F320 Major depressive disorder, single episode, mild: Secondary | ICD-10-CM | POA: Diagnosis not present

## 2024-02-16 ENCOUNTER — Ambulatory Visit: Admitting: Physical Therapy

## 2024-02-16 ENCOUNTER — Encounter: Payer: Self-pay | Admitting: Physical Therapy

## 2024-02-16 DIAGNOSIS — R29898 Other symptoms and signs involving the musculoskeletal system: Secondary | ICD-10-CM

## 2024-02-16 DIAGNOSIS — M5459 Other low back pain: Secondary | ICD-10-CM

## 2024-02-16 NOTE — Patient Instructions (Signed)

## 2024-02-16 NOTE — Therapy (Signed)
 OUTPATIENT PHYSICAL THERAPY EVALUATION   Patient Name: Richard [name] MRN: [id number] DOB:1970/09/27, 53 y.o., male Today's Date: 02/16/2024  END OF SESSION:  PT End of Session - 02/16/24 0902     Visit Number 1    Number of Visits 6    Date for Recertification  03/29/24    Authorization Type [employer] 20% coinsurance    PT Start Time 0805    PT Stop Time 0842    PT Time Calculation (min) 37 min    Activity Tolerance Patient tolerated treatment well    Behavior During Therapy [facility] for tasks assessed/performed          Past Medical History:  Diagnosis Date   Back pain    Cataract    Class 3 severe obesity due to excess calories with body mass index (BMI) of 40.0 to 44.9 in adult (HCC) 09/03/2023   Depression    Diverticulosis    Family history of prostate cancer in father 01/18/2019   Gallbladder problem    Glaucoma    Hyperlipidemia    Lactose intolerance    Medical history non-contributory    Overweight    Sleep apnea 10/10/2015   Vitamin D  deficiency    Past Surgical History:  Procedure Laterality Date   cerebral ataxia     as a baby   CHOLECYSTECTOMY  1992   COSMETIC SURGERY Left    lipoma removal, arm/ forehead   NASAL SEPTOPLASTY W/ TURBINOPLASTY Bilateral 10/10/2015   Procedure: NASAL SEPTOPLASTY WITH BILATERAL TURBINATE REDUCTION;  Surgeon: Marlyce Finer, MD;  Location: [facility] OR;  Service: ENT;  Laterality: Bilateral;   REPAIR OF COMPLEX TRACTION RETINAL DETACHMENT Right 2023   UVULECTOMY N/A 10/10/2015   Procedure: COLEEN DIEGO ASSISTED;  Surgeon: Marlyce Finer, MD;  Location: [facility] OR;  Service: ENT;  Laterality: N/A;   Patient Active Problem List   Diagnosis Date Noted   Viral URI with cough 09/03/2023   Class 3 severe obesity due to excess calories with body mass index (BMI) of 40.0 to 44.9 in adult (HCC) 09/03/2023   Cellulitis and abscess of left lower extremity 01/21/2022   Retinal detachment of right eye with multiple breaks 01/21/2022   Lower  respiratory infection 03/30/2020   Insulin  resistance 11/10/2019   Family history of prostate cancer in father 01/18/2019   Vasectomy evaluation 01/18/2019   Obesity (BMI 30.0-34.9) 01/15/2018   Chronic foot pain 01/15/2018   Preventative health care 12/11/2015   Onychomycosis 10/18/2015   Sleep apnea 10/10/2015   Tinea pedis of both feet 07/29/2015   Xerosis of skin 07/29/2015   Toenail deformity 06/26/2015   Lipoma 06/26/2015   Chronic nasal congestion 06/26/2015   Vitamin D  deficiency    Elevated cholesterol     PCP: Gretta Comer POUR, NP  REFERRING PROVIDER: Gust Molly, DO  REFERRING DIAG: M54.50 (ICD-10-CM) - Acute bilateral low back pain without sciatica  Rationale for Evaluation and Treatment: Rehabilitation  THERAPY DIAG:  Other low back pain - Plan: PT plan of care cert/re-cert  Other symptoms and signs involving the musculoskeletal system - Plan: PT plan of care cert/re-cert  ONSET DATE: worse over the past 1-2 months   SUBJECTIVE:  SUBJECTIVE STATEMENT: Pt reports progressive history of LBP which has gotten worse over the past few weeks.  He's started more workouts with a trainer and pain has gotten progressively worse since then.  Recent trip with prolonged sitting has caused pain to get worse.    PERTINENT HISTORY:  Obesity, depression, HLD, sleep apnea  PAIN:  Are you having pain? Yes: NPRS scale: currently 3, up to 10, at best 1/10 Pain location: low back, Lt worse than Rt Pain description: spasm, tightness Aggravating factors: sitting  Relieving factors: movement, some light weightlifting  PRECAUTIONS:  None  RED FLAGS: None   WEIGHT BEARING RESTRICTIONS:  No  FALLS:  Has patient fallen in last 6 months? No  LIVING ENVIRONMENT: Lives with: lives with  their son (5 y/o son) Lives in: House/apartment   OCCUPATION:  Environmental Education Officer at [employer]   PLOF:  Independent and Leisure: runner, broadcasting/film/video, walking, hiking, movies, wood working  PATIENT GOALS:  Improve pain   OBJECTIVE:  Note: Objective measures were completed at Evaluation unless otherwise noted.  DIAGNOSTIC FINDINGS:  X-rays: Mild anterior end plate spurring at L5-S1 with some loss of disc space at this level  PATIENT SURVEYS:  Patient-Specific Activity Scoring Scheme  0 represents "unable to perform." 10 represents "able to perform at prior level. 0 1 2 3 4 5 6 7 8 9  10 (Date and Score)   Activity Eval     1. Bending to pick things up 3    2. Sitting without pain 1    3. Weight training 5   4. Sitting up from lying down without pain 5   Score 3.5    Total score = sum of the activity scores/number of activities Minimum detectable change (90%CI) for average score = 2 points Minimum detectable change (90%CI) for single activity score = 3 points    COGNITIVE STATUS: Within functional limits for tasks assessed   SENSATION: WFL  POSTURE:  rounded shoulders, forward head, and decreased lumbar lordosis   GAIT: 02/16/24 Comments: independent   PALPATION: 02/16/24 hypomobility noted with lumbar spine; trigger point in Lt QL   LUMBAR ROM:   ROM A/PROM  eval  Flexion Limited 25% with pain  Extension WNL with pain  Right quadrant WNL  Left quadrant WNL   (Blank rows = not tested)     LOWER EXTREMITY MMT:   [facility] for activities assessed   TREATMENT:                                                                                                                              DATE:  02/16/24 TherEx See HEP - demonstrated with trial reps performed PRN, min cues for comprehension    Self Care Educated on POC and clinical findings, gym program modifications, dry needling consideration     PATIENT EDUCATION:  Education details: HEP, DN Person educated:  Patient Education method: Explanation, Demonstration, and Handouts Education comprehension: verbalized understanding, returned demonstration, and  needs further education  HOME EXERCISE PROGRAM: Access Code: [email or web address] URL: [email or web address] Date: 02/16/2024 Prepared by: Corean Ku  Exercises - Hooklying Single Knee to Chest  - 2 x daily - 7 x weekly - 1 sets - 3 reps - 30 sec hold - Supine Double Knee to Chest  - 2 x daily - 7 x weekly - 1 sets - 3 reps - 30 sec hold - Supine Lumbar Rotation  - 2 x daily - 7 x weekly - 1 sets - 3 reps - 30 sec hold - Standing Quadratus Lumborum Stretch with Doorway  - 2 x daily - 7 x weekly - 1 sets - 3 reps - 30 sec hold - Standing Quadratus Lumborum Mobilization with Small Ball on Wall  - 2-3 x daily - 7 x weekly - 1 sets - 1 reps - 2-3 min hold   ASSESSMENT:  CLINICAL IMPRESSION: Patient is a 53 y.o. male who was seen today for physical therapy evaluation and treatment for LBP. He demonstrates continued pain, hypomobility and active trigger points affecting functional mobility.  He will benefit from PT to address deficits listed.     OBJECTIVE IMPAIRMENTS: decreased ROM, hypomobility, increased fascial restrictions, increased muscle spasms, impaired flexibility, postural dysfunction, and pain.   ACTIVITY LIMITATIONS: lifting, bending, sitting, transfers, and locomotion level  PARTICIPATION LIMITATIONS: cleaning, laundry, shopping, community activity, and occupation  PERSONAL FACTORS: Past/current experiences and 3+ comorbidities: Obesity, depression, HLD, sleep apnea are also affecting patient's functional outcome.   REHAB POTENTIAL: Good  CLINICAL DECISION MAKING: Stable/uncomplicated  EVALUATION COMPLEXITY: Low   GOALS: Goals reviewed with patient? Yes  SHORT TERM GOALS: Target date: 03/08/2024  Independent with initial HEP Goal status: INITIAL  LONG TERM GOALS: Target date: 03/29/2024  Independent with  final HEP Goal status: INITIAL  2.  PSFS score improved by 2 points Goal status: INITIAL  3.  Lumbar flexion improved to no more than 25% limited without pain for improved mobility Goal status: INIITAL  4.  Report pain < 4/10 with job functions for improved function Goal status: INITIAL   PLAN:  PT FREQUENCY: 1x/week (will see up to 1x/wk if needed)  PT DURATION: 6 weeks  PLANNED INTERVENTIONS: 97164- PT Re-evaluation, 97750- Physical Performance Testing, 97110-Therapeutic exercises, 97530- Therapeutic activity, 97112- Neuromuscular re-education, 97535- Self Care, [id number]- Manual therapy, [phone]- Aquatic Therapy, [id number]- Electrical stimulation (unattended), [id number]- Ultrasound, 02987- Traction (mechanical), 20560 (1-2 muscles), 20561 (3+ muscles)- Dry Needling, Patient/Family education, Taping, Joint mobilization, Joint manipulation, Spinal manipulation, Spinal mobilization, Cryotherapy, and Moist heat.  PLAN FOR NEXT SESSION: Review HEP, consider DN if pt returns to [facility]   NEXT MD VISIT: 04/13/24   Corean JULIANNA Ku, PT, DPT 02/16/24 9:10 AM

## 2024-02-17 DIAGNOSIS — F320 Major depressive disorder, single episode, mild: Secondary | ICD-10-CM | POA: Diagnosis not present

## 2024-04-05 ENCOUNTER — Telehealth: Payer: Self-pay

## 2024-04-05 NOTE — Telephone Encounter (Signed)
 Need to r/s to 2pm

## 2024-04-05 NOTE — Telephone Encounter (Signed)
 Lvm to r/s pt with Dr Mariah

## 2024-04-13 ENCOUNTER — Ambulatory Visit
